# Patient Record
Sex: Female | Born: 1970
Health system: Southern US, Community
[De-identification: ages and names within clinical notes are randomized; demographics above are authoritative.]

## PROBLEM LIST (undated history)

## (undated) DIAGNOSIS — D649 Anemia, unspecified: Secondary | ICD-10-CM

## (undated) DIAGNOSIS — E785 Hyperlipidemia, unspecified: Secondary | ICD-10-CM

## (undated) DIAGNOSIS — T7840XA Allergy, unspecified, initial encounter: Secondary | ICD-10-CM

## (undated) DIAGNOSIS — K219 Gastro-esophageal reflux disease without esophagitis: Secondary | ICD-10-CM

## (undated) DIAGNOSIS — I10 Essential (primary) hypertension: Secondary | ICD-10-CM

## (undated) DIAGNOSIS — R7989 Other specified abnormal findings of blood chemistry: Secondary | ICD-10-CM

## (undated) DIAGNOSIS — N951 Menopausal and female climacteric states: Secondary | ICD-10-CM

## (undated) DIAGNOSIS — E860 Dehydration: Secondary | ICD-10-CM

## (undated) DIAGNOSIS — L309 Dermatitis, unspecified: Secondary | ICD-10-CM

## (undated) HISTORY — DX: Gastro-esophageal reflux disease without esophagitis: K21.9

## (undated) HISTORY — DX: Essential (primary) hypertension: I10

## (undated) HISTORY — DX: Anemia, unspecified: D64.9

## (undated) HISTORY — DX: Menopausal and female climacteric states: N95.1

## (undated) HISTORY — DX: Allergy, unspecified, initial encounter: T78.40XA

## (undated) HISTORY — DX: Dehydration: E86.0

## (undated) HISTORY — DX: Hyperlipidemia, unspecified: E78.5

## (undated) HISTORY — DX: Other specified abnormal findings of blood chemistry: R79.89

## (undated) HISTORY — PX: COLONOSCOPY: SHX174

## (undated) HISTORY — PX: NO PAST SURGERIES: SHX2092

## (undated) HISTORY — DX: Dermatitis, unspecified: L30.9

---

## 2011-06-14 ENCOUNTER — Ambulatory Visit (INDEPENDENT_AMBULATORY_CARE_PROVIDER_SITE_OTHER): Payer: 59 | Admitting: Internal Medicine

## 2011-06-14 ENCOUNTER — Encounter: Payer: Self-pay | Admitting: Internal Medicine

## 2011-06-14 VITALS — BP 130/86 | HR 81 | Ht 59.5 in | Wt 149.0 lb

## 2011-06-14 DIAGNOSIS — R42 Dizziness and giddiness: Secondary | ICD-10-CM

## 2011-06-14 LAB — CBC WITH DIFFERENTIAL/PLATELET
Basophils Absolute: 0 10*3/uL (ref 0.0–0.1)
Eosinophils Absolute: 0.5 10*3/uL (ref 0.0–0.7)
Eosinophils Relative: 7 % — ABNORMAL HIGH (ref 0–5)
HCT: 41.3 % (ref 36.0–46.0)
Lymphocytes Relative: 26 % (ref 12–46)
Lymphs Abs: 1.9 10*3/uL (ref 0.7–4.0)
MCH: 27.2 pg (ref 26.0–34.0)
MCV: 85.9 fL (ref 78.0–100.0)
Monocytes Absolute: 0.4 10*3/uL (ref 0.1–1.0)
Platelets: 371 10*3/uL (ref 150–400)
RDW: 13.6 % (ref 11.5–15.5)
WBC: 7.2 10*3/uL (ref 4.0–10.5)

## 2011-06-14 LAB — BASIC METABOLIC PANEL
BUN: 11 mg/dL (ref 6–23)
CO2: 25 mEq/L (ref 19–32)
Calcium: 9.8 mg/dL (ref 8.4–10.5)
Chloride: 100 mEq/L (ref 96–112)
Creat: 0.72 mg/dL (ref 0.50–1.10)

## 2011-06-14 MED ORDER — MECLIZINE HCL 25 MG PO TABS: 25.0000 mg | ORAL_TABLET | Freq: Three times a day (TID) | ORAL | Status: AC | PRN

## 2011-06-19 DIAGNOSIS — R42 Dizziness and giddiness: Secondary | ICD-10-CM | POA: Insufficient documentation

## 2011-06-19 NOTE — Progress Notes (Signed)
  Subjective:    Patient ID: Tara Greene, female    DOB: 1971-05-02, 40 y.o.   MRN: [id number]  HPI patient is going to establish primary care and for evaluation of dizziness. Negative three-day history of dizziness with associated nausea. No emesis or neurologic deficits such as numbness tingling or weakness. There has been no presyncope or syncope. Sensation worsened with head turning or turning in the bed. No recent infection fever or chills. Taking no medication for this. Last Pap smear 2005 and declines further evaluations currently. No other complaint  Reviewed past medical history, past surgical history, medications, allergies, social history and family history  Review of Systems  Constitutional: Negative for fever, chills and fatigue.  Gastrointestinal: Positive for nausea. Negative for vomiting.  Neurological: Positive for dizziness. Negative for syncope, facial asymmetry, speech difficulty, weakness, numbness and headaches.       Objective:   Physical Exam    Physical Exam  Vitals reviewed. Constitutional:  appears well-developed and well-nourished. No distress.  HENT:  Head: Normocephalic and atraumatic.  Right Ear: Tympanic membrane, external ear and ear canal normal.  Left Ear: Tympanic membrane, external ear and ear canal normal.  Nose: Nose normal.  Mouth/Throat: Oropharynx is clear and moist. No oropharyngeal exudate.  Eyes: Conjunctivae and EOM are normal. Pupils are equal, round, and reactive to light. Right eye exhibits no discharge. Left eye exhibits no discharge. No scleral icterus.  Neck: Neck supple. No thyromegaly present.  Cardiovascular: Normal rate, regular rhythm and normal heart sounds.  Exam reveals no gallop and no friction rub.   No murmur heard. Pulmonary/Chest: Effort normal and breath sounds normal. No respiratory distress.  has no wheezes.  has no rales.  Lymphadenopathy:   no cervical adenopathy.  Neurological:  is alert.  Skin: Skin is warm  and dry.  not diaphoretic.  Psychiatric: normal mood and affect.      Assessment & Plan:

## 2011-06-19 NOTE — Assessment & Plan Note (Signed)
Obtain CBC and Chem-7. Suspect viral labyrinthitis. Provide Antivert p.r.n. Followup if no improvement or worsening.

## 2011-09-19 ENCOUNTER — Ambulatory Visit (INDEPENDENT_AMBULATORY_CARE_PROVIDER_SITE_OTHER): Payer: 59 | Admitting: Internal Medicine

## 2011-09-19 ENCOUNTER — Encounter: Payer: Self-pay | Admitting: Internal Medicine

## 2011-09-19 ENCOUNTER — Other Ambulatory Visit: Payer: Self-pay | Admitting: Internal Medicine

## 2011-09-19 DIAGNOSIS — Z0000 Encounter for general adult medical examination without abnormal findings: Secondary | ICD-10-CM

## 2011-09-19 DIAGNOSIS — Z1239 Encounter for other screening for malignant neoplasm of breast: Secondary | ICD-10-CM

## 2011-09-19 DIAGNOSIS — Z1231 Encounter for screening mammogram for malignant neoplasm of breast: Secondary | ICD-10-CM

## 2011-09-19 LAB — LIPID PANEL
Cholesterol: 189 mg/dL (ref 0–200)
HDL: 45 mg/dL (ref >39)
Total CHOL/HDL Ratio: 4.2 Ratio
Triglycerides: 87 mg/dL (ref <150)
VLDL: 17 mg/dL (ref 0–40)

## 2011-09-19 LAB — URINALYSIS
Bilirubin Urine: NEGATIVE
Glucose, UA: NEGATIVE mg/dL
Ketones, ur: NEGATIVE mg/dL
Protein, ur: 30 mg/dL — AB

## 2011-09-19 LAB — BASIC METABOLIC PANEL
BUN: 11 mg/dL (ref 6–23)
Calcium: 9.2 mg/dL (ref 8.4–10.5)
Creat: 0.74 mg/dL (ref 0.50–1.10)
Glucose, Bld: 84 mg/dL (ref 70–99)

## 2011-09-19 LAB — HEPATIC FUNCTION PANEL
Albumin: 4.2 g/dL (ref 3.5–5.2)
Bilirubin, Direct: 0.1 mg/dL (ref 0.0–0.3)
Total Bilirubin: 0.5 mg/dL (ref 0.3–1.2)

## 2011-09-19 NOTE — Patient Instructions (Signed)
Please schedule screening mammogram for next week.  Please schedule a visit with Melissa for your pap smear at your convenience.

## 2011-09-20 DIAGNOSIS — Z0000 Encounter for general adult medical examination without abnormal findings: Secondary | ICD-10-CM | POA: Insufficient documentation

## 2011-09-20 NOTE — Assessment & Plan Note (Signed)
Nl exam. Obtain screening labs. Schedule screening mammogram and pap/pelvic/cbe with female provider. Influenza vaccination provided

## 2011-09-20 NOTE — Progress Notes (Signed)
  Subjective:    Patient ID: Tara Greene, female    DOB: 1971/04/17, 40 y.o.   MRN: [id number]  HPI Pt presents to clinic for annual exam. Overdue for mammogram with no h/o abnormals. Recalls last pap smear ~2005 and prefers female provider. Last seen for dizziness thought to be secondary to possible viral labyrinthitis. Sx's resolved after several days and have not recurred. No complaints.   Past Medical History  Diagnosis Date  . GERD (gastroesophageal reflux disease)    No past surgical history on file.  reports that she has never smoked. She has never used smokeless tobacco. She reports that she does not drink alcohol or use illicit drugs. family history includes Hyperlipidemia in her mother and Hypertension in her mother. No Known Allergies     Review of Systems  Constitutional: Negative for unexpected weight change.  Respiratory: Negative for shortness of breath.   Cardiovascular: Negative for chest pain.  Gastrointestinal: Negative for abdominal pain.  Neurological: Negative for dizziness.       Objective:   Physical Exam  Nursing note and vitals reviewed. Constitutional: She appears well-developed and well-nourished. No distress.  HENT:  Head: Normocephalic and atraumatic.  Right Ear: Tympanic membrane, external ear and ear canal normal.  Left Ear: Tympanic membrane, external ear and ear canal normal.  Nose: Nose normal.  Mouth/Throat: Uvula is midline, oropharynx is clear and moist and mucous membranes are normal. No oropharyngeal exudate.  Eyes: Conjunctivae and EOM are normal. Pupils are equal, round, and reactive to light. Right eye exhibits no discharge. Left eye exhibits no discharge. No scleral icterus.  Neck: Neck supple. Carotid bruit is not present.  Cardiovascular: Normal rate, regular rhythm, normal heart sounds and intact distal pulses.  Exam reveals no gallop and no friction rub.   No murmur heard. Pulmonary/Chest: Effort normal and breath sounds  normal. No respiratory distress. She has no wheezes. She has no rales.  Abdominal: Soft. Normal appearance and bowel sounds are normal. She exhibits no distension and no mass. There is no hepatosplenomegaly. There is no tenderness. There is no rigidity.  Lymphadenopathy:    She has no cervical adenopathy.  Neurological: She is alert.  Skin: Skin is warm and dry. She is not diaphoretic.  Psychiatric: She has a normal mood and affect.          Assessment & Plan:

## 2011-09-27 ENCOUNTER — Other Ambulatory Visit (HOSPITAL_COMMUNITY)
Admission: RE | Admit: 2011-09-27 | Discharge: 2011-09-27 | Disposition: A | Discharge status: Home / Self Care | Payer: 59 | Source: Ambulatory Visit | Attending: Family | Admitting: Family

## 2011-09-27 ENCOUNTER — Ambulatory Visit (INDEPENDENT_AMBULATORY_CARE_PROVIDER_SITE_OTHER): Payer: 59 | Admitting: Family

## 2011-09-27 ENCOUNTER — Encounter: Payer: Self-pay | Admitting: Family

## 2011-09-27 ENCOUNTER — Ambulatory Visit (HOSPITAL_BASED_OUTPATIENT_CLINIC_OR_DEPARTMENT_OTHER)
Admission: RE | Admit: 2011-09-27 | Discharge: 2011-09-27 | Disposition: A | Discharge status: Home / Self Care | Payer: 59 | Source: Ambulatory Visit | Attending: Internal Medicine | Admitting: Internal Medicine

## 2011-09-27 DIAGNOSIS — K6389 Other specified diseases of intestine: Secondary | ICD-10-CM | POA: Insufficient documentation

## 2011-09-27 DIAGNOSIS — Z01419 Encounter for gynecological examination (general) (routine) without abnormal findings: Secondary | ICD-10-CM | POA: Insufficient documentation

## 2011-09-27 DIAGNOSIS — Z1239 Encounter for other screening for malignant neoplasm of breast: Secondary | ICD-10-CM

## 2011-09-27 DIAGNOSIS — N858 Other specified noninflammatory disorders of uterus: Secondary | ICD-10-CM | POA: Insufficient documentation

## 2011-09-27 DIAGNOSIS — Z1231 Encounter for screening mammogram for malignant neoplasm of breast: Secondary | ICD-10-CM

## 2011-09-27 DIAGNOSIS — N9489 Other specified conditions associated with female genital organs and menstrual cycle: Secondary | ICD-10-CM

## 2011-09-27 NOTE — Progress Notes (Signed)
  Subjective:    Patient ID: Tara Greene, female    DOB: 03/23/1971, 40 y.o.   MRN: [id number]  HPI  Tara Greene is a 40 yr old female who presents today for her GYN examination. She reports that periods are regular.  Last 3 days. Occasional cramping.  [id number].  Denies concerns about STD exposure.  Last pap smear was greater than 2 years ago. Has never had an abnormal pap smear.  She has not had regular pap smears.    Review of Systems    see HPI  Past Medical History  Diagnosis Date  . GERD (gastroesophageal reflux disease)     History   Social History  . Marital Status: Married    Spouse Name: N/A    Number of Children: 3  . 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 Smoking status: Never Smoker   . Smokeless tobacco: Never Used  . Alcohol Use: No  . Drug Use: No  . Sexually Active: Not on file   Other Topics Concern  . Not on file   Social History Narrative  . No narrative on file    No past surgical history on file.  Family History  Problem Relation Age of Onset  . Hypertension Mother   . Hyperlipidemia Mother     No Known Allergies  Current Outpatient Prescriptions on File Prior to Visit  Medication Sig Dispense Refill  . Multiple Vitamin (MULTIVITAMIN) tablet Take 1 tablet by mouth daily.          BP 122/86  Pulse 78  Temp(Src) 97.8 F (36.6 C) (Oral)  Resp 16  Wt 152 lb 1.3 oz (68.983 kg)  LMP 09/20/2011    Objective:   Physical Exam  Constitutional: She appears well-developed and well-nourished.  Abdominal: Soft. She exhibits no distension and no mass. There is no tenderness. There is no guarding.  Musculoskeletal: She exhibits no edema.  Skin: Skin is warm and dry.  Psychiatric: She has a normal mood and affect. Her behavior is normal. Judgment and thought content normal.    Breasts:Declined Inguinal/mons: Normal without inguinal adenopathy  External genitalia: Normal    BUS/Urethra/Skene's glands: Normal  Bladder: Normal  Vagina: Normal  Cervix: + nabothian cyst noted near cervical os Uterus: normal in size, shape. Midline and mobile. Small mobile Pedunculated mass Palpated on uterus adjacent to the cervix on the left Adnexa/parametria:  Rt: Without masses or tenderness.  Lt: Without masses or tenderness.  Anus and perineum: Normal        Assessment & Plan:

## 2011-09-27 NOTE — Assessment & Plan Note (Signed)
Pt declined breast exam today- had mammogram earlier today.  Results are pending.  Pap performed today. She will need a follow up pap in 1 year if normal as she has not had regular screening.

## 2011-09-27 NOTE — Assessment & Plan Note (Signed)
Pt is noted to have a palpable pedunculated mass on the left lower uterus.  Likely an external fibroid, but will refer to GYN for further evaluation.

## 2011-09-27 NOTE — Patient Instructions (Signed)
We will contact you about your referral to GYN. We will mail you a copy of your Pap smear results.

## 2011-09-30 ENCOUNTER — Encounter: Payer: Self-pay | Admitting: Family

## 2012-04-03 ENCOUNTER — Ambulatory Visit (INDEPENDENT_AMBULATORY_CARE_PROVIDER_SITE_OTHER): Payer: 59 | Admitting: Internal Medicine

## 2012-04-03 ENCOUNTER — Encounter: Payer: Self-pay | Admitting: Internal Medicine

## 2012-04-03 VITALS — BP 126/82 | HR 112 | Temp 98.1°F | Resp 16 | Wt 153.0 lb

## 2012-04-03 DIAGNOSIS — J40 Bronchitis, not specified as acute or chronic: Secondary | ICD-10-CM | POA: Insufficient documentation

## 2012-04-03 MED ORDER — AMOXICILLIN-POT CLAVULANATE 875-125 MG PO TABS: 1.0000 | ORAL_TABLET | Freq: Two times a day (BID) | ORAL | Status: AC

## 2012-04-03 NOTE — Progress Notes (Signed)
  Subjective:    Patient ID: Tara Greene, female    DOB: 07/05/1971, 41 y.o.   MRN: [id number]  HPI Pt presents to clinic for evaluation of cough and nasal congestion. Has had difficulty with allergies since last month but last 5+ days notes f/c, increasing nasal congestion/drainage (yellow) and cough productive for yellow sputum. Has noted blood tinged/streaks in sputum without gross hemoptysis. Taking otc AH/decongestant without improvement.   Past Medical History  Diagnosis Date  . GERD (gastroesophageal reflux disease)    No past surgical history on file.  reports that she has never smoked. She has never used smokeless tobacco. She reports that she does not drink alcohol or use illicit drugs. family history includes Hyperlipidemia in her mother and Hypertension in her mother. No Known Allergies   Review of Systems see hpi     Objective:   Physical Exam  Nursing note and vitals reviewed. Constitutional: She appears well-developed and well-nourished. No distress.  HENT:  Head: Normocephalic and atraumatic.  Right Ear: Tympanic membrane, external ear and ear canal normal.  Left Ear: Tympanic membrane, external ear and ear canal normal.  Nose: Nose normal.  Mouth/Throat: Oropharynx is clear and moist. No oropharyngeal exudate.  Eyes: Conjunctivae are normal. No scleral icterus.  Neck: Neck supple.  Cardiovascular: Normal rate, regular rhythm and normal heart sounds.   Pulmonary/Chest: Effort normal and breath sounds normal. No respiratory distress. She has no wheezes. She has no rales.  Lymphadenopathy:    She has no cervical adenopathy.  Neurological: She is alert.  Skin: She is not diaphoretic.  Psychiatric: She has a normal mood and affect.          Assessment & Plan:

## 2012-04-03 NOTE — Assessment & Plan Note (Signed)
Begin abx tx. Continue otc sx relief prn. Followup if no improvement or worsening.

## 2012-06-14 ENCOUNTER — Ambulatory Visit: Payer: 59 | Admitting: Internal Medicine

## 2012-06-14 DIAGNOSIS — Z0289 Encounter for other administrative examinations: Secondary | ICD-10-CM

## 2012-08-31 ENCOUNTER — Telehealth: Payer: Self-pay | Admitting: Internal Medicine

## 2012-08-31 ENCOUNTER — Other Ambulatory Visit: Payer: Self-pay | Admitting: Internal Medicine

## 2012-08-31 DIAGNOSIS — Z0000 Encounter for general adult medical examination without abnormal findings: Secondary | ICD-10-CM

## 2012-08-31 DIAGNOSIS — Z1231 Encounter for screening mammogram for malignant neoplasm of breast: Secondary | ICD-10-CM

## 2012-09-07 LAB — CBC WITH DIFFERENTIAL/PLATELET
Basophils Absolute: 0 10*3/uL (ref 0.0–0.1)
Basophils Relative: 0 % (ref 0–1)
Eosinophils Absolute: 0.3 10*3/uL (ref 0.0–0.7)
Eosinophils Relative: 7 % — ABNORMAL HIGH (ref 0–5)
HCT: 35.3 % — ABNORMAL LOW (ref 36.0–46.0)
MCH: 25.1 pg — ABNORMAL LOW (ref 26.0–34.0)
MCHC: 32.3 g/dL (ref 30.0–36.0)
MCV: 77.6 fL — ABNORMAL LOW (ref 78.0–100.0)
Monocytes Absolute: 0.3 10*3/uL (ref 0.1–1.0)
Platelets: 362 10*3/uL (ref 150–400)
RDW: 15.7 % — ABNORMAL HIGH (ref 11.5–15.5)

## 2012-09-07 NOTE — Telephone Encounter (Signed)
Orders entered and given to the lab. 

## 2012-09-07 NOTE — Telephone Encounter (Signed)
Yes pls

## 2012-09-07 NOTE — Telephone Encounter (Signed)
Is it ok to order: cbc w/diff, bmp, lipids/lfts, tsh and u/a--v70.0 for physical labs?

## 2012-09-08 LAB — HEPATIC FUNCTION PANEL
Albumin: 3.9 g/dL (ref 3.5–5.2)
Indirect Bilirubin: 0.5 mg/dL (ref 0.0–0.9)
Total Bilirubin: 0.6 mg/dL (ref 0.3–1.2)
Total Protein: 6.8 g/dL (ref 6.0–8.3)

## 2012-09-08 LAB — URINALYSIS, MICROSCOPIC ONLY
Bacteria, UA: NONE SEEN
Squamous Epithelial / LPF: NONE SEEN

## 2012-09-08 LAB — URINALYSIS, ROUTINE W REFLEX MICROSCOPIC
Bilirubin Urine: NEGATIVE
Glucose, UA: NEGATIVE mg/dL
Ketones, ur: NEGATIVE mg/dL
Protein, ur: NEGATIVE mg/dL

## 2012-09-08 LAB — BASIC METABOLIC PANEL
CO2: 27 mEq/L (ref 19–32)
Calcium: 9.1 mg/dL (ref 8.4–10.5)
Creat: 0.69 mg/dL (ref 0.50–1.10)
Glucose, Bld: 90 mg/dL (ref 70–99)
Sodium: 140 mEq/L (ref 135–145)

## 2012-09-08 LAB — LIPID PANEL
HDL: 43 mg/dL (ref >39)
Total CHOL/HDL Ratio: 4.9 Ratio
Triglycerides: 102 mg/dL (ref <150)

## 2012-09-11 ENCOUNTER — Ambulatory Visit (INDEPENDENT_AMBULATORY_CARE_PROVIDER_SITE_OTHER): Payer: 59 | Admitting: Internal Medicine

## 2012-09-11 ENCOUNTER — Encounter: Payer: Self-pay | Admitting: Internal Medicine

## 2012-09-11 VITALS — BP 124/82 | HR 78 | Temp 98.5°F | Resp 14 | Ht 60.0 in | Wt 149.2 lb

## 2012-09-11 DIAGNOSIS — D649 Anemia, unspecified: Secondary | ICD-10-CM

## 2012-09-11 DIAGNOSIS — Z0000 Encounter for general adult medical examination without abnormal findings: Secondary | ICD-10-CM

## 2012-09-11 DIAGNOSIS — E785 Hyperlipidemia, unspecified: Secondary | ICD-10-CM

## 2012-09-11 NOTE — Patient Instructions (Signed)
Please schedule a visit with Tara Greene for your pap smear. Prior to that visit please got to the lab for non fasting tests (cbc, ferritin, b12-anemia)

## 2012-09-15 DIAGNOSIS — D649 Anemia, unspecified: Secondary | ICD-10-CM | POA: Insufficient documentation

## 2012-09-15 DIAGNOSIS — E785 Hyperlipidemia, unspecified: Secondary | ICD-10-CM | POA: Insufficient documentation

## 2012-09-15 NOTE — Progress Notes (Signed)
  Subjective:    Patient ID: Tara Greene, female    DOB: 11-12-1971, 41 y.o.   MRN: [id number]  HPI patient presents to clinic for annual exam. Requests female for GYN exam. Reviewed mild anemia with hemoglobin of 11.4. LDL cholesterol mildly elevated. Due for mammogram.  Past Medical History  Diagnosis Date  . GERD (gastroesophageal reflux disease)    No past surgical history on file.  reports that she has never smoked. She has never used smokeless tobacco. She reports that she does not drink alcohol or use illicit drugs. family history includes Hyperlipidemia in her mother and Hypertension in her mother. No Known Allergies   Review of Systems see history of present illness     Objective:   Physical Exam  Nursing note and vitals reviewed. Constitutional: She appears well-developed and well-nourished. No distress.  HENT:  Head: Normocephalic and atraumatic.  Right Ear: External ear normal.  Left Ear: External ear normal.  Nose: Nose normal.  Mouth/Throat: Oropharynx is clear and moist. No oropharyngeal exudate.  Eyes: Conjunctivae normal are normal. Pupils are equal, round, and reactive to light. No scleral icterus.  Neck: Neck supple. No thyromegaly present.  Cardiovascular: Normal rate, regular rhythm, normal heart sounds and intact distal pulses.  Exam reveals no gallop and no friction rub.   No murmur heard. Pulmonary/Chest: Effort normal and breath sounds normal. No respiratory distress. She has no wheezes. She has no rales.  Abdominal: Soft. Bowel sounds are normal. She exhibits no distension and no mass. There is no tenderness. There is no rebound and no guarding.  Lymphadenopathy:    She has no cervical adenopathy.  Neurological: She is alert.  Skin: Skin is warm and dry. She is not diaphoretic.  Psychiatric: She has a normal mood and affect.          Assessment & Plan:

## 2012-09-15 NOTE — Assessment & Plan Note (Signed)
Normal exam. Schedule mammogram. Return for GYN exam.

## 2012-09-15 NOTE — Assessment & Plan Note (Signed)
Return for repeat CBC, ferritin and B12 level

## 2012-09-15 NOTE — Assessment & Plan Note (Signed)
Mild elevations. Pursue low-fat diet and regular exercise

## 2012-09-18 ENCOUNTER — Encounter: Payer: Self-pay | Admitting: Family

## 2012-09-18 ENCOUNTER — Ambulatory Visit (INDEPENDENT_AMBULATORY_CARE_PROVIDER_SITE_OTHER): Payer: 59 | Admitting: Family

## 2012-09-18 ENCOUNTER — Other Ambulatory Visit (HOSPITAL_COMMUNITY)
Admission: RE | Admit: 2012-09-18 | Discharge: 2012-09-18 | Disposition: A | Discharge status: Home / Self Care | Payer: 59 | Source: Ambulatory Visit | Attending: Family | Admitting: Family

## 2012-09-18 VITALS — BP 136/82 | HR 79 | Temp 97.9°F | Resp 16 | Ht 60.0 in | Wt 150.0 lb

## 2012-09-18 DIAGNOSIS — N858 Other specified noninflammatory disorders of uterus: Secondary | ICD-10-CM

## 2012-09-18 DIAGNOSIS — N9489 Other specified conditions associated with female genital organs and menstrual cycle: Secondary | ICD-10-CM

## 2012-09-18 DIAGNOSIS — Z01419 Encounter for gynecological examination (general) (routine) without abnormal findings: Secondary | ICD-10-CM | POA: Insufficient documentation

## 2012-09-18 DIAGNOSIS — Z124 Encounter for screening for malignant neoplasm of cervix: Secondary | ICD-10-CM

## 2012-09-18 NOTE — Assessment & Plan Note (Addendum)
Unchanged.  Likely small fibroid.  She tells me she did see GYN last year for this, but we did not receive any paperwork back from them. Will request consult report.

## 2012-09-18 NOTE — Assessment & Plan Note (Signed)
Clinically stable.  Pap performed today.

## 2012-09-18 NOTE — Patient Instructions (Addendum)
We will contact you with the results of your pap smear.  

## 2012-09-18 NOTE — Addendum Note (Signed)
Addended by: Mervin Kung A on: 09/18/2012 02:14 PM   Modules accepted: Orders

## 2012-09-18 NOTE — Progress Notes (Signed)
  Subjective:    Patient ID: Tara Greene, female    DOB: 09/27/1971, 41 y.o.   MRN: [id number]  HPI  Tara Greene is a 41 yr old female who presents today for pap smear.  She reports no problems with menses.  LMP 10/12.  Denies hx of abnormal pap. Mammogram up to date.      Review of Systems See HPI.     Objective:   Physical Exam  Constitutional: She appears well-developed and well-nourished. No distress.  Abdominal: Soft.  Skin: Skin is warm and dry.  Psychiatric: She has a normal mood and affect. Her behavior is normal. Judgment and thought content normal.  Inguinal/mons: Normal without inguinal adenopathy  External genitalia: Normal  BUS/Urethra/Skene's glands: Normal  Bladder: Normal  Vagina: Normal  Cervix: Normal  Uterus: normal in size, shape.  Small pedunculated mass noted left lower external uterus unchanged from last year.  Most consistent with small fibroid. Uterus is  Midline and mobile  Adnexa/parametria:  Rt: Without masses or tenderness.  Lt: Without masses or tenderness.  Anus and perineum: Normal          Assessment & Plan:

## 2012-09-21 ENCOUNTER — Encounter: Payer: Self-pay | Admitting: Family

## 2012-10-02 ENCOUNTER — Ambulatory Visit (HOSPITAL_BASED_OUTPATIENT_CLINIC_OR_DEPARTMENT_OTHER)
Admission: RE | Admit: 2012-10-02 | Discharge: 2012-10-02 | Disposition: A | Discharge status: Home / Self Care | Payer: 59 | Source: Ambulatory Visit | Attending: Internal Medicine | Admitting: Internal Medicine

## 2012-10-02 DIAGNOSIS — Z1231 Encounter for screening mammogram for malignant neoplasm of breast: Secondary | ICD-10-CM | POA: Insufficient documentation

## 2013-06-27 ENCOUNTER — Emergency Department (HOSPITAL_COMMUNITY)
Admission: EM | Admit: 2013-06-27 | Discharge: 2013-06-27 | Disposition: A | Discharge status: Home / Self Care | Payer: 59 | Source: Home / Self Care | Attending: Family Medicine | Admitting: Family Medicine

## 2013-06-27 ENCOUNTER — Encounter (HOSPITAL_COMMUNITY): Payer: Self-pay | Admitting: Emergency Medicine

## 2013-06-27 DIAGNOSIS — Z23 Encounter for immunization: Secondary | ICD-10-CM

## 2013-06-27 DIAGNOSIS — S61209A Unspecified open wound of unspecified finger without damage to nail, initial encounter: Secondary | ICD-10-CM

## 2013-06-27 DIAGNOSIS — S61210A Laceration without foreign body of right index finger without damage to nail, initial encounter: Secondary | ICD-10-CM

## 2013-06-27 MED ORDER — TETANUS-DIPHTH-ACELL PERTUSSIS 5-2.5-18.5 LF-MCG/0.5 IM SUSP
0.5000 mL | Freq: Once | INTRAMUSCULAR | Status: AC
  Administered 2013-06-27: 0.5 mL via INTRAMUSCULAR

## 2013-06-27 MED ORDER — TETANUS-DIPHTH-ACELL PERTUSSIS 5-2.5-18.5 LF-MCG/0.5 IM SUSP
INTRAMUSCULAR | Status: AC
  Filled 2013-06-27: qty 0.5

## 2013-06-27 MED ORDER — MUPIROCIN 2 % EX OINT: TOPICAL_OINTMENT | Freq: Three times a day (TID) | CUTANEOUS | Status: DC

## 2013-06-27 NOTE — ED Notes (Signed)
Pt is soaking finger in sterile water 

## 2013-06-27 NOTE — ED Provider Notes (Addendum)
CSN: [id number]     Arrival date & time 06/27/13  1735 History     First MD Initiated Contact with Patient 06/27/13 1751     Chief Complaint  Patient presents with  . Extremity Laceration   (Consider location/radiation/quality/duration/timing/severity/associated sxs/prior Treatment) HPI Comments: 42 year old nondiabetic female here complaining of a laceration to her right index finger with a kitchen knife was she was cutting the tip of a pain bottle while working on an [employer] about an hour ago. denies numbness or weakness. Bleeding has stopped. Last tetanus immunization around 10 years ago.    Past Medical History  Diagnosis Date  . GERD (gastroesophageal reflux disease)    History reviewed. No pertinent past surgical history. Family History  Problem Relation Age of Onset  . Hypertension Mother   . Hyperlipidemia Mother    History  Substance Use Topics  . Smoking status: Never Smoker   . Smokeless tobacco: Never Used  . Alcohol Use: No   OB History   Grav Para Term Preterm Abortions TAB SAB Ect Mult Living                 Review of Systems  Skin: Positive for wound.       As per history of present illness  Neurological: Negative for weakness and numbness.    Allergies  Review of patient's allergies indicates no known allergies.  Home Medications   Current Outpatient Rx  Name  Route  Sig  Dispense  Refill  . cetirizine (ZYRTEC) 10 MG tablet   Oral   Take 10 mg by mouth as needed.          . loratadine-pseudoephedrine (CLARITIN-D 24-HOUR) 10-240 MG per 24 hr tablet   Oral   Take 1 tablet by mouth as needed.          . Multiple Vitamin (MULTIVITAMIN) tablet   Oral   Take 1 tablet by mouth daily.           . mupirocin ointment (BACTROBAN) 2 %   Topical   Apply topically 3 (three) times daily.   22 g   0    BP 147/89  Pulse 70  Temp(Src) 98.5 F (36.9 C) (Oral)  Resp 18  SpO2 100%  LMP 06/24/2013 Physical Exam  Nursing note and vitals  reviewed. Constitutional: She is oriented to person, place, and time. She appears well-developed and well-nourished. No distress.  Cardiovascular: Normal heart sounds.   Pulmonary/Chest: Breath sounds normal.  Musculoskeletal:  Full range of motion of right index finger at the MP joint, PIP joint and DIP joint.   Neurological: She is alert and oriented to person, place, and time.  Intact superficial and deep sensation of the right index finger and right hand entirely.  Skin: She is not diaphoretic.  There is a oblique 1 inch linear, clean hemostatic laceration on the dorsal radial side of proximal phalanx of the index finger. No associated hematoma. No foreign bodies.    ED Course   LACERATION REPAIR Date/Time: 06/27/2013 8:22 AM Performed by: Sharin Grave Authorized by: Sharin Grave Consent: Verbal consent obtained. Risks and benefits: risks, benefits and alternatives were discussed Consent given by: patient Patient understanding: patient states understanding of the procedure being performed Patient consent: the patient's understanding of the procedure matches consent given Body area: upper extremity Location details: right index finger Laceration length: 2 cm Foreign bodies: no foreign bodies Tendon involvement: none Nerve involvement: none Vascular damage: no Anesthesia: local infiltration Local anesthetic:  lidocaine 1% with epinephrine Anesthetic total: 2 ml Preparation: Patient was prepped and draped in the usual sterile fashion. Irrigation solution: saline Amount of cleaning: standard Debridement: none Degree of undermining: none Skin closure: 3-0 Prolene Number of sutures: 7 Technique: simple Approximation: close Approximation difficulty: simple Dressing: antibiotic ointment, 4x4 sterile gauze and gauze roll Patient tolerance: Patient tolerated the procedure well with no immediate complications.   (including critical care time)  Labs Reviewed - No  data to display No results found. 1. Laceration of right index finger w/o foreign body w/o damage to nail, initial encounter     MDM  Status post suture repair today. Tdap administered. Prescribed mupirocin ointment. Supportive care including 1 care and red flags that should prompt her return to medical attention discussed with patient and provided in writing. Specifically patient was asked to return in 7 days for suture removal or earlier if redness swelling or drainage.  Sharin Grave, MD 06/28/13 1610  Sharin Grave, MD 06/28/13 [id number]

## 2013-06-27 NOTE — ED Notes (Signed)
Pt c/o laceration to right index finger onset about an hour ago... Laceration is about 1 inch... Reports she was doing arts and craft project w/a kitchen knife; knife slipped and cut her finger... Bleeding controlled; last tetanus <10 yrs... Alert w/no signs of acute distress.

## 2013-10-14 ENCOUNTER — Ambulatory Visit (INDEPENDENT_AMBULATORY_CARE_PROVIDER_SITE_OTHER): Payer: 59 | Admitting: Family Medicine

## 2013-10-14 ENCOUNTER — Encounter: Payer: Self-pay | Admitting: Family Medicine

## 2013-10-14 VITALS — BP 142/82 | HR 71 | Temp 98.4°F | Ht 60.0 in | Wt 144.1 lb

## 2013-10-14 DIAGNOSIS — T7840XA Allergy, unspecified, initial encounter: Secondary | ICD-10-CM | POA: Insufficient documentation

## 2013-10-14 DIAGNOSIS — D649 Anemia, unspecified: Secondary | ICD-10-CM

## 2013-10-14 DIAGNOSIS — Z0000 Encounter for general adult medical examination without abnormal findings: Secondary | ICD-10-CM

## 2013-10-14 DIAGNOSIS — L309 Dermatitis, unspecified: Secondary | ICD-10-CM

## 2013-10-14 DIAGNOSIS — Z5189 Encounter for other specified aftercare: Secondary | ICD-10-CM

## 2013-10-14 DIAGNOSIS — T7840XD Allergy, unspecified, subsequent encounter: Secondary | ICD-10-CM

## 2013-10-14 DIAGNOSIS — E785 Hyperlipidemia, unspecified: Secondary | ICD-10-CM

## 2013-10-14 DIAGNOSIS — L259 Unspecified contact dermatitis, unspecified cause: Secondary | ICD-10-CM

## 2013-10-14 LAB — RENAL FUNCTION PANEL
BUN: 17 mg/dL (ref 6–23)
Chloride: 103 mEq/L (ref 96–112)
Creat: 0.83 mg/dL (ref 0.50–1.10)
Glucose, Bld: 94 mg/dL (ref 70–99)
Phosphorus: 2.9 mg/dL (ref 2.3–4.6)
Potassium: 4.2 mEq/L (ref 3.5–5.3)

## 2013-10-14 LAB — CBC
MCH: 26.1 pg (ref 26.0–34.0)
Platelets: 366 10*3/uL (ref 150–400)
RBC: 4.64 MIL/uL (ref 3.87–5.11)

## 2013-10-14 LAB — LIPID PANEL
HDL: 49 mg/dL (ref >39)
LDL Cholesterol: 103 mg/dL — ABNORMAL HIGH (ref 0–99)
Triglycerides: 86 mg/dL (ref <150)
VLDL: 17 mg/dL (ref 0–40)

## 2013-10-14 MED ORDER — TRIAMCINOLONE ACETONIDE 0.1 % EX CREA: 1.0000 "application " | TOPICAL_CREAM | Freq: Two times a day (BID) | CUTANEOUS | Status: DC

## 2013-10-14 NOTE — Patient Instructions (Signed)
Clotrimazole cream twice daily til a week after gone Hydrate    Eczema Atopic dermatitis, or eczema, is an inherited type of sensitive skin. Often people with eczema have a family history of allergies, asthma, or hay fever. It causes a red itchy rash and dry scaly skin. The itchiness may occur before the skin rash and may be very intense. It is not contagious. Eczema is generally worse during the cooler winter months and often improves with the warmth of summer. Eczema usually starts showing signs in infancy. Some children outgrow eczema, but it may last through adulthood. Flare-ups may be caused by:  Eating something or contact with something you are sensitive or allergic to.  Stress. DIAGNOSIS  The diagnosis of eczema is usually based upon symptoms and medical history. TREATMENT  Eczema cannot be cured, but symptoms usually can be controlled with treatment or avoidance of allergens (things to which you are sensitive or allergic to).  Controlling the itching and scratching.  Use over-the-counter antihistamines as directed for itching. It is especially useful at night when the itching tends to be worse.  Use over-the-counter steroid creams as directed for itching.  Scratching makes the rash and itching worse and may cause impetigo (a skin infection) if fingernails are contaminated (dirty).  Keeping the skin well moisturized with creams every day. This will seal in moisture and help prevent dryness. Lotions containing alcohol and water can dry the skin and are not recommended.  Limiting exposure to allergens.  Recognizing situations that cause stress.  Developing a plan to manage stress. HOME CARE INSTRUCTIONS   Take prescription and over-the-counter medicines as directed by your caregiver.  Do not use anything on the skin without checking with your caregiver.  Keep baths or showers short (5 minutes) in warm (not hot) water. Use mild cleansers for bathing. You may add non-perfumed  bath oil to the bath water. It is best to avoid soap and bubble bath.  Immediately after a bath or shower, when the skin is still damp, apply a moisturizing ointment to the entire body. This ointment should be a petroleum ointment. This will seal in moisture and help prevent dryness. The thicker the ointment the better. These should be unscented.  Keep fingernails cut short and wash hands often. If your child has eczema, it may be necessary to put soft gloves or mittens on your child at night.  Dress in clothes made of cotton or cotton blends. Dress lightly, as heat increases itching.  Avoid foods that may cause flare-ups. Common foods include cow's milk, peanut butter, eggs and wheat.  Keep a child with eczema away from anyone with fever blisters. The virus that causes fever blisters (herpes simplex) can cause a serious skin infection in children with eczema. SEEK MEDICAL CARE IF:   Itching interferes with sleep.  The rash gets worse or is not better within one week following treatment.  The rash looks infected (pus or soft yellow scabs).  You or your child has an oral temperature above 102 F (38.9 C).  Your baby is older than 3 months with a rectal temperature of 100.5 F (38.1 C) or higher for more than 1 day.  The rash flares up after contact with someone who has fever blisters. SEEK IMMEDIATE MEDICAL CARE IF:   Your baby is older than 3 months with a rectal temperature of 102 F (38.9 C) or higher.  Your baby is older than 3 months or younger with a rectal temperature of 100.4 F (38  C) or higher. Document Released: 11/11/2000 Document Revised: 02/06/2012 Document Reviewed: 06/17/2013 [facility] Patient Information 2014 [place], [place]. Body Ringworm Ringworm (tinea corporis) is a fungal infection of the skin on the body. This infection is not caused by worms, but is actually caused by a fungus. Fungus normally lives on the top of your skin and can be useful. However, in the  case of ringworms, the fungus grows out of control and causes a skin infection. It can involve any area of skin on the body and can spread easily from one person to another (contagious). Ringworm is a common problem for children, but it can affect adults as well. Ringworm is also often found in athletes, especially wrestlers who share equipment and mats.  CAUSES  Ringworm of the body is caused by a fungus called dermatophyte. It can spread by:  Touchingother people who are infected.  Touchinginfected pets.  Touching or sharingobjects that have been in contact with the infected person or pet (hats, combs, towels, clothing, sports equipment). SYMPTOMS   Itchy, raised red spots and bumps on the skin.  Ring-shaped rash.  Redness near the border of the rash with a clear center.  Dry and scaly skin on or around the rash. Not every person develops a ring-shaped rash. Some develop only the red, scaly patches. DIAGNOSIS  Most often, ringworm can be diagnosed by performing a skin exam. Your caregiver may choose to take a skin scraping from the affected area. The sample will be examined under the microscope to see if the fungus is present.  TREATMENT  Body ringworm may be treated with a topical antifungal cream or ointment. Sometimes, an antifungal shampoo that can be used on your body is prescribed. You may be prescribed antifungal medicines to take by mouth if your ringworm is severe, keeps coming back, or lasts a long time.  HOME CARE INSTRUCTIONS   Only take over-the-counter or prescription medicines as directed by your caregiver.  Wash the infected area and dry it completely before applying yourcream or ointment.  When using antifungal shampoo to treat the ringworm, leave the shampoo on the body for 3 5 minutes before rinsing.   Wear loose clothing to stop clothes from rubbing and irritating the rash.  Wash or change your bed sheets every night while you have the rash.  Have your  pet treated by your veterinarian if it has the same infection. To prevent ringworm:   Practice good hygiene.  Wear sandals or shoes in public places and showers.  Do not share personal items with others.  Avoid touching red patches of skin on other people.  Avoid touching pets that have bald spots or wash your hands after doing so. SEEK MEDICAL CARE IF:   Your rash continues to spread after 7 days of treatment.  Your rash is not gone in 4 weeks.  The area around your rash becomes red, warm, tender, and swollen. Document Released: 11/11/2000 Document Revised: 08/08/2012 Document Reviewed: 05/28/2012 [facility] Patient Information 2014 [place], [place].

## 2013-10-14 NOTE — Progress Notes (Signed)
Tara Greene [id number] 28-Dec-1970 10/14/2013      Progress Note-Follow Up  Subjective  Chief Complaint  Chief Complaint  Patient presents with  . Annual Exam    physical    HPI  Patient is a 42 year old female who is in today for annual exam. She feels well but she struggling with that rash at the moment. It is pruritic and has been present for several weeks. She is stressed over-the-counter treatments without great results. Denies any trauma or previous similar rash. No other complaints. Does struggle with allergies but they are well controlled on the ranitidine. No fevers, headache, chest pain, palpitations, shortness of breath, GI or GU concerns at this time.  Past Medical History  Diagnosis Date  . GERD (gastroesophageal reflux disease)     History reviewed. No pertinent past surgical history.  Family History  Problem Relation Age of Onset  . Hypertension Mother   . Hyperlipidemia Mother     History   Social History  . Marital Status: Married    Spouse Name: N/A    Number of Children: 3  . 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 Smoking status: Never Smoker   . Smokeless tobacco: Never Used  . Alcohol Use: No  . Drug Use: No  . Sexual Activity: Not on file   Other Topics Concern  . Not on file   Social History Narrative  . No narrative on file    Current Outpatient Prescriptions on File Prior to Visit  Medication Sig Dispense Refill  . Multiple Vitamin (MULTIVITAMIN) tablet Take 1 tablet by mouth daily.        . cetirizine (ZYRTEC) 10 MG tablet Take 10 mg by mouth as needed.       . loratadine-pseudoephedrine (CLARITIN-D 24-HOUR) 10-240 MG per 24 hr tablet Take 1 tablet by mouth as needed.        No current facility-administered medications on file prior to visit.    No Known Allergies  Review of Systems  Review of Systems  Constitutional: Negative for fever and malaise/fatigue.  HENT:  Negative for congestion.   Eyes: Negative for discharge.  Respiratory: Negative for shortness of breath.   Cardiovascular: Negative for chest pain, palpitations and leg swelling.  Gastrointestinal: Negative for nausea, abdominal pain and diarrhea.  Genitourinary: Negative for dysuria.  Musculoskeletal: Negative for falls.  Skin: Positive for itching and rash.  Neurological: Negative for loss of consciousness and headaches.  Endo/Heme/Allergies: Negative for polydipsia.  Psychiatric/Behavioral: Negative for depression and suicidal ideas. The patient is not nervous/anxious and does not have insomnia.     Objective  BP 142/82  Pulse 71  Temp(Src) 98.4 F (36.9 C) (Oral)  Ht 5' (1.524 m)  Wt 144 lb 1.9 oz (65.372 kg)  BMI 28.15 kg/m2  SpO2 97%  LMP 09/11/2013  Physical Exam  Physical Exam  Constitutional: She is oriented to person, place, and time and well-developed, well-nourished, and in no distress. No distress.  HENT:  Head: Normocephalic and atraumatic.  Right Ear: External ear normal.  Left Ear: External ear normal.  Nose: Nose normal.  Mouth/Throat: Oropharynx is clear and moist. No oropharyngeal exudate.  Eyes: Conjunctivae are normal. Pupils are equal, round, and reactive to light. Right eye exhibits no discharge. Left eye exhibits no discharge. No scleral icterus.  Neck: Normal range of motion. Neck supple. No thyromegaly present.  Cardiovascular: Normal rate, regular rhythm, normal heart  sounds and intact distal pulses.   No murmur heard. Pulmonary/Chest: Effort normal and breath sounds normal. No respiratory distress. She has no wheezes. She has no rales.  Abdominal: Soft. Bowel sounds are normal. She exhibits no distension and no mass. There is no tenderness.  Musculoskeletal: Normal range of motion. She exhibits no edema and no tenderness.  Lymphadenopathy:    She has no cervical adenopathy.  Neurological: She is alert and oriented to person, place, and time. She  has normal reflexes. No cranial nerve deficit. Coordination normal.  Skin: Skin is warm and dry. Rash noted. She is not diaphoretic. There is erythema.  Round raised patches on right arm. Scattered maculopapular lesions elsewhere  Psychiatric: Mood, memory and affect normal.    Lab Results  Component Value Date   TSH 2.335 09/07/2012   Lab Results  Component Value Date   WBC 5.2 09/07/2012   HGB 11.4* 09/07/2012   HCT 35.3* 09/07/2012   MCV 77.6* 09/07/2012   PLT 362 09/07/2012   Lab Results  Component Value Date   CREATININE 0.69 09/07/2012   BUN 8 09/07/2012   NA 140 09/07/2012   K 3.9 09/07/2012   CL 105 09/07/2012   CO2 27 09/07/2012   Lab Results  Component Value Date   ALT 18 09/07/2012   AST 19 09/07/2012   ALKPHOS 61 09/07/2012   BILITOT 0.6 09/07/2012   Lab Results  Component Value Date   CHOL 209* 09/07/2012   Lab Results  Component Value Date   HDL 43 09/07/2012   Lab Results  Component Value Date   LDLCALC 146* 09/07/2012   Lab Results  Component Value Date   TRIG 102 09/07/2012   Lab Results  Component Value Date   CHOLHDL 4.9 09/07/2012     Assessment & Plan  Dermatitis Eczema vs ringworm. Right arm appears more like ringworm apply Clotrimazole then triamcinolone for other lestions just Triamcinolone, notify us if no improvement  Anemia Resolved with recent labs  Hyperlipidemia Mild, improved. No changes  Allergy Responding to LoratadineD may continue the same  Routine general medical examination at a health care facility Encouraged heart healthy diet, regular exercise and adequate sleep. Reviewed annual labs received flu shot on 09/11/13

## 2013-10-14 NOTE — Progress Notes (Signed)
Pre visit review using our clinic review tool, if applicable. No additional management support is needed unless otherwise documented below in the visit note. 

## 2013-10-18 ENCOUNTER — Encounter: Payer: Self-pay | Admitting: Family Medicine

## 2013-10-18 DIAGNOSIS — L309 Dermatitis, unspecified: Secondary | ICD-10-CM

## 2013-10-18 DIAGNOSIS — E785 Hyperlipidemia, unspecified: Secondary | ICD-10-CM

## 2013-10-18 HISTORY — DX: Hyperlipidemia, unspecified: E78.5

## 2013-10-18 HISTORY — DX: Dermatitis, unspecified: L30.9

## 2013-10-18 NOTE — Assessment & Plan Note (Signed)
Encouraged heart healthy diet, regular exercise and adequate sleep. Reviewed annual labs received flu shot on 09/11/13

## 2013-10-18 NOTE — Assessment & Plan Note (Signed)
Responding to LoratadineD may continue the same

## 2013-10-18 NOTE — Assessment & Plan Note (Signed)
Eczema vs ringworm. Right arm appears more like ringworm apply Clotrimazole then triamcinolone for other lestions just Triamcinolone, notify us if no improvement

## 2013-10-18 NOTE — Assessment & Plan Note (Signed)
Mild, improved. No changes

## 2013-10-18 NOTE — Assessment & Plan Note (Signed)
Resolved with recent labs. 

## 2014-04-07 ENCOUNTER — Other Ambulatory Visit (HOSPITAL_COMMUNITY)
Admission: RE | Admit: 2014-04-07 | Discharge: 2014-04-07 | Disposition: A | Discharge status: Home / Self Care | Payer: 59 | Source: Ambulatory Visit | Attending: Family Medicine | Admitting: Family Medicine

## 2014-04-07 ENCOUNTER — Encounter: Payer: Self-pay | Admitting: Family Medicine

## 2014-04-07 ENCOUNTER — Ambulatory Visit (INDEPENDENT_AMBULATORY_CARE_PROVIDER_SITE_OTHER): Payer: 59 | Admitting: Family Medicine

## 2014-04-07 VITALS — BP 124/78 | HR 88 | Temp 98.5°F | Ht 60.0 in | Wt 152.0 lb

## 2014-04-07 DIAGNOSIS — N951 Menopausal and female climacteric states: Secondary | ICD-10-CM

## 2014-04-07 DIAGNOSIS — Z01419 Encounter for gynecological examination (general) (routine) without abnormal findings: Secondary | ICD-10-CM

## 2014-04-07 DIAGNOSIS — E785 Hyperlipidemia, unspecified: Secondary | ICD-10-CM

## 2014-04-07 DIAGNOSIS — T7840XA Allergy, unspecified, initial encounter: Secondary | ICD-10-CM

## 2014-04-07 DIAGNOSIS — D649 Anemia, unspecified: Secondary | ICD-10-CM

## 2014-04-07 MED ORDER — FLUTICASONE PROPIONATE 50 MCG/ACT NA SUSP: 1.0000 | Freq: Every day | NASAL | Status: DC

## 2014-04-07 MED ORDER — FEXOFENADINE HCL 180 MG PO TABS: 180.0000 mg | ORAL_TABLET | Freq: Every day | ORAL | Status: AC

## 2014-04-07 NOTE — Assessment & Plan Note (Signed)
resolved 

## 2014-04-07 NOTE — Assessment & Plan Note (Signed)
Mild, Encouraged heart healthy diet, increase exercise, avoid trans fats, consider a krill oil cap daily 

## 2014-04-07 NOTE — Progress Notes (Signed)
Patient ID: Tara Greene, female   DOB: [birth year], 43 y.o.   MRN: [id number] Tara Greene [id number] [birth year] 04/07/2014      Progress Note-Follow Up  Subjective  Chief Complaint  Chief Complaint  Patient presents with  . Gynecologic Exam    pap    HPI  Patient is a 43 year old female in today for routine medical care. Is here today for GYN exam doing well. No GYN c/o is having MGM next week. No breast concerns. Has struggled with some mild congestion this spring, no recent illness. Denies CP/palp/SOB/HA/congestion/fevers/GI or GU c/o. Taking meds as prescribed  Past Medical History  Diagnosis Date  . GERD (gastroesophageal reflux disease)   . Allergy   . Hyperlipidemia   . Anemia   . Other and unspecified hyperlipidemia 10/18/2013  . Dermatitis 10/18/2013    History reviewed. No pertinent past surgical history.  Family History  Problem Relation Age of Onset  . Hypertension Mother   . Hyperlipidemia Mother   . Cancer Mother     colon   . COPD Father     History   Social History  . Marital Status: Married    Spouse Name: N/A    Number of Children: 3  . 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 Smoking status: Never Smoker   . Smokeless tobacco: Never Used  . Alcohol Use: No  . Drug Use: No  . 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 Not on file   Social History Narrative  . No narrative on file    Current Outpatient Prescriptions on File Prior to Visit  Medication Sig Dispense Refill  . Multiple Vitamin (MULTIVITAMIN) tablet Take 1 tablet by mouth daily.        Marland Kitchen triamcinolone cream (KENALOG) 0.1 % Apply 1 application topically 2 (two) times daily. As needed for rash  85.2 g  2   No current facility-administered medications on file prior to visit.    No Known Allergies  Review of  Systems  Review of Systems  Constitutional: Negative for fever, chills and malaise/fatigue.  HENT: Positive for congestion. Negative for hearing loss and nosebleeds.   Eyes: Negative for discharge.  Respiratory: Negative for cough, sputum production, shortness of breath and wheezing.   Cardiovascular: Negative for chest pain, palpitations and leg swelling.  Gastrointestinal: Negative for heartburn, nausea, vomiting, abdominal pain, diarrhea, constipation and blood in stool.  Genitourinary: Negative for dysuria, urgency, frequency and hematuria.  Musculoskeletal: Negative for back pain, falls and myalgias.  Skin: Negative for rash.  Neurological: Negative for dizziness, tremors, sensory change, focal weakness, loss of consciousness, weakness and headaches.  Endo/Heme/Allergies: Negative for polydipsia. Does not bruise/bleed easily.  Psychiatric/Behavioral: Negative for depression and suicidal ideas. The patient is not nervous/anxious and does not have insomnia.     Objective  BP 124/78  Pulse 88  Temp(Src) 98.5 F (36.9 C) (Oral)  Ht 5' (1.524 m)  Wt 152 lb 0.6 oz (68.965 kg)  BMI 29.69 kg/m2  SpO2 96%  LMP 12/29/2013  Physical Exam  Physical Exam  Constitutional: She is oriented to person, place, and time and well-developed, well-nourished, and in no distress. No distress.  HENT:  Head: Normocephalic and atraumatic.  Eyes: Conjunctivae are normal.  Neck: Neck supple. No thyromegaly present.  Cardiovascular: Normal rate, regular rhythm and normal heart sounds.   No murmur heard. Pulmonary/Chest: Effort normal and breath sounds normal. She has no wheezes.  Abdominal: She exhibits no distension and no mass.  Genitourinary: Vagina normal, uterus normal, cervix normal, right adnexa normal and left adnexa normal. No vaginal discharge found.  Musculoskeletal: She exhibits no edema.  Lymphadenopathy:    She has no cervical adenopathy.  Neurological: She is alert and oriented to  person, place, and time.  Skin: Skin is warm and dry. No rash noted. She is not diaphoretic.  Psychiatric: Memory, affect and judgment normal.    Lab Results  Component Value Date   TSH 1.822 10/14/2013   Lab Results  Component Value Date   WBC 6.1 10/14/2013   HGB 12.1 10/14/2013   HCT 36.3 10/14/2013   MCV 78.2 10/14/2013   PLT 366 10/14/2013   Lab Results  Component Value Date   CREATININE 0.83 10/14/2013   BUN 17 10/14/2013   NA 137 10/14/2013   K 4.2 10/14/2013   CL 103 10/14/2013   CO2 26 10/14/2013   Lab Results  Component Value Date   ALT 18 09/07/2012   AST 19 09/07/2012   ALKPHOS 61 09/07/2012   BILITOT 0.6 09/07/2012   Lab Results  Component Value Date   CHOL 169 10/14/2013   Lab Results  Component Value Date   HDL 49 10/14/2013   Lab Results  Component Value Date   LDLCALC 103* 10/14/2013   Lab Results  Component Value Date   TRIG 86 10/14/2013   Lab Results  Component Value Date   CHOLHDL 3.4 10/14/2013     Assessment & Plan  Routine gynecological examination LMP 2/15. Hot flashes, insomnia. Pap today. Encouraged small, frequent meals, minimize carbs, alcohol, caffiene. Increase exercise  Hyperlipidemia Mild, Encouraged heart healthy diet, increase exercise, avoid trans fats, consider a krill oil cap daily  Anemia resolved  Allergy Good response to Allegra and Flonase. Continue the same  Perimenopause With hot flashes, insomnia. Encouraged regular exercise, small frequent meals with lean proteins, minimal carbs and adequate hydration. Consider Icool and call if worsens

## 2014-04-07 NOTE — Progress Notes (Signed)
Pre visit review using our clinic review tool, if applicable. No additional management support is needed unless otherwise documented below in the visit note. 

## 2014-04-07 NOTE — Patient Instructions (Signed)
Allergic Rhinitis Allergic rhinitis is when the mucous membranes in the nose respond to allergens. Allergens are particles in the air that cause your body to have an allergic reaction. This causes you to release allergic antibodies. Through a chain of events, these eventually cause you to release histamine into the blood stream. Although meant to protect the body, it is this release of histamine that causes your discomfort, such as frequent sneezing, congestion, and an itchy, runny nose.  CAUSES  Seasonal allergic rhinitis (hay fever) is caused by pollen allergens that may come from grasses, trees, and weeds. Year-round allergic rhinitis (perennial allergic rhinitis) is caused by allergens such as house dust mites, pet dander, and mold spores.  SYMPTOMS   Nasal stuffiness (congestion).  Itchy, runny nose with sneezing and tearing of the eyes. DIAGNOSIS  Your health care provider can help you determine the allergen or allergens that trigger your symptoms. If you and your health care provider are unable to determine the allergen, skin or blood testing may be used. TREATMENT  Allergic Rhinitis does not have a cure, but it can be controlled by:  Medicines and allergy shots (immunotherapy).  Avoiding the allergen. Hay fever may often be treated with antihistamines in pill or nasal spray forms. Antihistamines block the effects of histamine. There are over-the-counter medicines that may help with nasal congestion and swelling around the eyes. Check with your health care provider before taking or giving this medicine.  If avoiding the allergen or the medicine prescribed do not work, there are many new medicines your health care provider can prescribe. Stronger medicine may be used if initial measures are ineffective. Desensitizing injections can be used if medicine and avoidance does not work. Desensitization is when a patient is given ongoing shots until the body becomes less sensitive to the allergen.  Make sure you follow up with your health care provider if problems continue. HOME CARE INSTRUCTIONS It is not possible to completely avoid allergens, but you can reduce your symptoms by taking steps to limit your exposure to them. It helps to know exactly what you are allergic to so that you can avoid your specific triggers. SEEK MEDICAL CARE IF:   You have a fever.  You develop a cough that does not stop easily (persistent).  You have shortness of breath.  You start wheezing.  Symptoms interfere with normal daily activities. Document Released: 08/09/2001 Document Revised: 09/04/2013 Document Reviewed: 07/22/2013 [employer] Patient Information 2014 [employer]  

## 2014-04-07 NOTE — Assessment & Plan Note (Signed)
Good response to Allegra and Flonase. Continue the same

## 2014-04-07 NOTE — Assessment & Plan Note (Addendum)
LMP 2/15. Hot flashes, insomnia. Pap today. Encouraged small, frequent meals, minimize carbs, alcohol, caffiene. Increase exercise

## 2014-04-12 ENCOUNTER — Encounter: Payer: Self-pay | Admitting: Family Medicine

## 2014-04-12 DIAGNOSIS — N951 Menopausal and female climacteric states: Secondary | ICD-10-CM

## 2014-04-12 HISTORY — DX: Menopausal and female climacteric states: N95.1

## 2014-04-12 NOTE — Assessment & Plan Note (Signed)
With hot flashes, insomnia. Encouraged regular exercise, small frequent meals with lean proteins, minimal carbs and adequate hydration. Consider Icool and call if worsens

## 2014-05-19 ENCOUNTER — Other Ambulatory Visit: Payer: Self-pay

## 2014-05-19 DIAGNOSIS — Z1231 Encounter for screening mammogram for malignant neoplasm of breast: Secondary | ICD-10-CM

## 2014-06-04 ENCOUNTER — Encounter (INDEPENDENT_AMBULATORY_CARE_PROVIDER_SITE_OTHER): Payer: Self-pay

## 2014-06-04 ENCOUNTER — Ambulatory Visit
Admission: RE | Admit: 2014-06-04 | Discharge: 2014-06-04 | Disposition: A | Discharge status: Home / Self Care | Payer: 59 | Source: Ambulatory Visit

## 2014-06-04 DIAGNOSIS — Z1231 Encounter for screening mammogram for malignant neoplasm of breast: Secondary | ICD-10-CM

## 2015-04-13 ENCOUNTER — Encounter: Payer: 59 | Admitting: Family Medicine

## 2015-05-13 ENCOUNTER — Telehealth: Payer: Self-pay | Admitting: Family Medicine

## 2015-05-13 NOTE — Telephone Encounter (Signed)
Pre Visit letter sent  °

## 2015-06-04 ENCOUNTER — Encounter: Payer: Self-pay | Admitting: Family Medicine

## 2015-06-04 ENCOUNTER — Ambulatory Visit (INDEPENDENT_AMBULATORY_CARE_PROVIDER_SITE_OTHER): Payer: 59 | Admitting: Family Medicine

## 2015-06-04 VITALS — BP 104/72 | HR 83 | Temp 98.8°F | Ht 61.0 in | Wt 152.4 lb

## 2015-06-04 DIAGNOSIS — R7989 Other specified abnormal findings of blood chemistry: Secondary | ICD-10-CM

## 2015-06-04 DIAGNOSIS — E782 Mixed hyperlipidemia: Secondary | ICD-10-CM

## 2015-06-04 DIAGNOSIS — Z0000 Encounter for general adult medical examination without abnormal findings: Secondary | ICD-10-CM

## 2015-06-04 DIAGNOSIS — E785 Hyperlipidemia, unspecified: Secondary | ICD-10-CM

## 2015-06-04 DIAGNOSIS — T7840XD Allergy, unspecified, subsequent encounter: Secondary | ICD-10-CM

## 2015-06-04 NOTE — Progress Notes (Signed)
Pre visit review using our clinic review tool, if applicable. No additional management support is needed unless otherwise documented below in the visit note. 

## 2015-06-04 NOTE — Patient Instructions (Signed)
Probiotics daily such as [employer] or [facility], or online at [email or web address] company has 10 strain probiotic Curcumen/Turmeric caps 1 daily Vitamin D 2000 IU cap daily Krill oil or Fish oil daily  Salon pas gel or patch . Aspercreme/Lidocaine patch Ibuprofen 420m every 6 hours as needed for severe pain with food   Preventive Care for Adults A healthy lifestyle and preventive care can promote health and wellness. Preventive health guidelines for women include the following key practices.  A routine yearly physical is a good way to check with your health care provider about your health and preventive screening. It is a chance to share any concerns and updates on your health and to receive a thorough exam.  Visit your dentist for a routine exam and preventive care every 6 months. Brush your teeth twice a day and floss once a day. Good oral hygiene prevents tooth decay and gum disease.  The frequency of eye exams is based on your age, health, family medical history, use of contact lenses, and other factors. Follow your health care provider's recommendations for frequency of eye exams.  Eat a healthy diet. Foods like vegetables, fruits, whole grains, low-fat dairy products, and lean protein foods contain the nutrients you need without too many calories. Decrease your intake of foods high in solid fats, added sugars, and salt. Eat the right amount of calories for you.Get information about a proper diet from your health care provider, if necessary.  Regular physical exercise is one of the most important things you can do for your health. Most adults should get at least 150 minutes of moderate-intensity exercise (any activity that increases your heart rate and causes you to sweat) each week. In addition, most adults need muscle-strengthening exercises on 2 or more days a week.  Maintain a healthy weight. The body mass index (BMI) is a screening tool to identify possible  weight problems. It provides an estimate of body fat based on height and weight. Your health care provider can find your BMI and can help you achieve or maintain a healthy weight.For adults 20 years and older:  A BMI below 18.5 is considered underweight.  A BMI of 18.5 to 24.9 is normal.  A BMI of 25 to 29.9 is considered overweight.  A BMI of 30 and above is considered obese.  Maintain normal blood lipids and cholesterol levels by exercising and minimizing your intake of saturated fat. Eat a balanced diet with plenty of fruit and vegetables. Blood tests for lipids and cholesterol should begin at age 7077and be repeated every 5 years. If your lipid or cholesterol levels are high, you are over 50, or you are at high risk for heart disease, you may need your cholesterol levels checked more frequently.Ongoing high lipid and cholesterol levels should be treated with medicines if diet and exercise are not working.  If you smoke, find out from your health care provider how to quit. If you do not use tobacco, do not start.  Lung cancer screening is recommended for adults aged 587-80years who are at high risk for developing lung cancer because of a history of smoking. A yearly low-dose CT scan of the lungs is recommended for people who have at least a 30-pack-year history of smoking and are a current smoker or have quit within the past 15 years. A pack year of smoking is smoking an average of 1 pack of cigarettes a day for 1 year (for example: 1 pack a day for  30 years or 2 packs a day for 15 years). Yearly screening should continue until the smoker has stopped smoking for at least 15 years. Yearly screening should be stopped for people who develop a health problem that would prevent them from having lung cancer treatment.  If you are pregnant, do not drink alcohol. If you are breastfeeding, be very cautious about drinking alcohol. If you are not pregnant and choose to drink alcohol, do not have more than 1  drink per day. One drink is considered to be 12 ounces (355 mL) of beer, 5 ounces (148 mL) of wine, or 1.5 ounces (44 mL) of liquor.  Avoid use of street drugs. Do not share needles with anyone. Ask for help if you need support or instructions about stopping the use of drugs.  High blood pressure causes heart disease and increases the risk of stroke. Your blood pressure should be checked at least every 1 to 2 years. Ongoing high blood pressure should be treated with medicines if weight loss and exercise do not work.  If you are 73-31 years old, ask your health care provider if you should take aspirin to prevent strokes.  Diabetes screening involves taking a blood sample to check your fasting blood sugar level. This should be done once every 3 years, after age 58, if you are within normal weight and without risk factors for diabetes. Testing should be considered at a younger age or be carried out more frequently if you are overweight and have at least 1 risk factor for diabetes.  Breast cancer screening is essential preventive care for women. You should practice "breast self-awareness." This means understanding the normal appearance and feel of your breasts and may include breast self-examination. Any changes detected, no matter how small, should be reported to a health care provider. Women in their 88s and 30s should have a clinical breast exam (CBE) by a health care provider as part of a regular health exam every 1 to 3 years. After age 59, women should have a CBE every year. Starting at age 73, women should consider having a mammogram (breast X-ray test) every year. Women who have a family history of breast cancer should talk to their health care provider about genetic screening. Women at a high risk of breast cancer should talk to their health care providers about having an MRI and a mammogram every year.  Breast cancer gene (BRCA)-related cancer risk assessment is recommended for women who have  family members with BRCA-related cancers. BRCA-related cancers include breast, ovarian, tubal, and peritoneal cancers. Having family members with these cancers may be associated with an increased risk for harmful changes (mutations) in the breast cancer genes BRCA1 and BRCA2. Results of the assessment will determine the need for genetic counseling and BRCA1 and BRCA2 testing.  Routine pelvic exams to screen for cancer are no longer recommended for nonpregnant women who are considered low risk for cancer of the pelvic organs (ovaries, uterus, and vagina) and who do not have symptoms. Ask your health care provider if a screening pelvic exam is right for you.  If you have had past treatment for cervical cancer or a condition that could lead to cancer, you need Pap tests and screening for cancer for at least 20 years after your treatment. If Pap tests have been discontinued, your risk factors (such as having a new sexual partner) need to be reassessed to determine if screening should be resumed. Some women have medical problems that increase the chance of  getting cervical cancer. In these cases, your health care provider may recommend more frequent screening and Pap tests.  The HPV test is an additional test that may be used for cervical cancer screening. The HPV test looks for the virus that can cause the cell changes on the cervix. The cells collected during the Pap test can be tested for HPV. The HPV test could be used to screen women aged 12 years and older, and should be used in women of any age who have unclear Pap test results. After the age of 6, women should have HPV testing at the same frequency as a Pap test.  Colorectal cancer can be detected and often prevented. Most routine colorectal cancer screening begins at the age of 91 years and continues through age 28 years. However, your health care provider may recommend screening at an earlier age if you have risk factors for colon cancer. On a yearly  basis, your health care provider may provide home test kits to check for hidden blood in the stool. Use of a small camera at the end of a tube, to directly examine the colon (sigmoidoscopy or colonoscopy), can detect the earliest forms of colorectal cancer. Talk to your health care provider about this at age 59, when routine screening begins. Direct exam of the colon should be repeated every 5-10 years through age 21 years, unless early forms of pre-cancerous polyps or small growths are found.  People who are at an increased risk for hepatitis B should be screened for this virus. You are considered at high risk for hepatitis B if:  You were born in a country where hepatitis B occurs often. Talk with your health care provider about which countries are considered high risk.  Your parents were born in a high-risk country and you have not received a shot to protect against hepatitis B (hepatitis B vaccine).  You have HIV or AIDS.  You use needles to inject street drugs.  You live with, or have sex with, someone who has hepatitis B.  You get hemodialysis treatment.  You take certain medicines for conditions like cancer, organ transplantation, and autoimmune conditions.  Hepatitis C blood testing is recommended for all people born from [birth year] through 1965 and any individual with known risks for hepatitis C.  Practice safe sex. Use condoms and avoid high-risk sexual practices to reduce the spread of sexually transmitted infections (STIs). STIs include gonorrhea, chlamydia, syphilis, trichomonas, herpes, HPV, and human immunodeficiency virus (HIV). Herpes, HIV, and HPV are viral illnesses that have no cure. They can result in disability, cancer, and death.  You should be screened for sexually transmitted illnesses (STIs) including gonorrhea and chlamydia if:  You are sexually active and are younger than 24 years.  You are older than 24 years and your health care provider tells you that you are at  risk for this type of infection.  Your sexual activity has changed since you were last screened and you are at an increased risk for chlamydia or gonorrhea. Ask your health care provider if you are at risk.  If you are at risk of being infected with HIV, it is recommended that you take a prescription medicine daily to prevent HIV infection. This is called preexposure prophylaxis (PrEP). You are considered at risk if:  You are a heterosexual woman, are sexually active, and are at increased risk for HIV infection.  You take drugs by injection.  You are sexually active with a partner who has HIV.  Talk  with your health care provider about whether you are at high risk of being infected with HIV. If you choose to begin PrEP, you should first be tested for HIV. You should then be tested every 3 months for as long as you are taking PrEP.  Osteoporosis is a disease in which the bones lose minerals and strength with aging. This can result in serious bone fractures or breaks. The risk of osteoporosis can be identified using a bone density scan. Women ages 65 years and over and women at risk for fractures or osteoporosis should discuss screening with their health care providers. Ask your health care provider whether you should take a calcium supplement or vitamin D to reduce the rate of osteoporosis.  Menopause can be associated with physical symptoms and risks. Hormone replacement therapy is available to decrease symptoms and risks. You should talk to your health care provider about whether hormone replacement therapy is right for you.  Use sunscreen. Apply sunscreen liberally and repeatedly throughout the day. You should seek shade when your shadow is shorter than you. Protect yourself by wearing long sleeves, pants, a wide-brimmed hat, and sunglasses year round, whenever you are outdoors.  Once a month, do a whole body skin exam, using a mirror to look at the skin on your back. Tell your health care  provider of new moles, moles that have irregular borders, moles that are larger than a pencil eraser, or moles that have changed in shape or color.  Stay current with required vaccines (immunizations).  Influenza vaccine. All adults should be immunized every year.  Tetanus, diphtheria, and acellular pertussis (Td, Tdap) vaccine. Pregnant women should receive 1 dose of Tdap vaccine during each pregnancy. The dose should be obtained regardless of the length of time since the last dose. Immunization is preferred during the 27th-36th week of gestation. An adult who has not previously received Tdap or who does not know her vaccine status should receive 1 dose of Tdap. This initial dose should be followed by tetanus and diphtheria toxoids (Td) booster doses every 10 years. Adults with an unknown or incomplete history of completing a 3-dose immunization series with Td-containing vaccines should begin or complete a primary immunization series including a Tdap dose. Adults should receive a Td booster every 10 years.  Varicella vaccine. An adult without evidence of immunity to varicella should receive 2 doses or a second dose if she has previously received 1 dose. Pregnant females who do not have evidence of immunity should receive the first dose after pregnancy. This first dose should be obtained before leaving the health care facility. The second dose should be obtained 4-8 weeks after the first dose.  Human papillomavirus (HPV) vaccine. Females aged 13-26 years who have not received the vaccine previously should obtain the 3-dose series. The vaccine is not recommended for use in pregnant females. However, pregnancy testing is not needed before receiving a dose. If a female is found to be pregnant after receiving a dose, no treatment is needed. In that case, the remaining doses should be delayed until after the pregnancy. Immunization is recommended for any person with an immunocompromised condition through the  age of 32 years if she did not get any or all doses earlier. During the 3-dose series, the second dose should be obtained 4-8 weeks after the first dose. The third dose should be obtained 24 weeks after the first dose and 16 weeks after the second dose.  Zoster vaccine. One dose is recommended for adults aged  60 years or older unless certain conditions are present.  Measles, mumps, and rubella (MMR) vaccine. Adults born before [birth year] generally are considered immune to measles and mumps. Adults born in [birth year] or later should have 1 or more doses of MMR vaccine unless there is a contraindication to the vaccine or there is laboratory evidence of immunity to each of the three diseases. A routine second dose of MMR vaccine should be obtained at least 28 days after the first dose for students attending postsecondary schools, health care workers, or international travelers. People who received inactivated measles vaccine or an unknown type of measles vaccine during 1963-1967 should receive 2 doses of MMR vaccine. People who received inactivated mumps vaccine or an unknown type of mumps vaccine before 1979 and are at high risk for mumps infection should consider immunization with 2 doses of MMR vaccine. For females of childbearing age, rubella immunity should be determined. If there is no evidence of immunity, females who are not pregnant should be vaccinated. If there is no evidence of immunity, females who are pregnant should delay immunization until after pregnancy. Unvaccinated health care workers born before [birth year] who lack laboratory evidence of measles, mumps, or rubella immunity or laboratory confirmation of disease should consider measles and mumps immunization with 2 doses of MMR vaccine or rubella immunization with 1 dose of MMR vaccine.  Pneumococcal 13-valent conjugate (PCV13) vaccine. When indicated, a person who is uncertain of her immunization history and has no record of immunization should receive the  PCV13 vaccine. An adult aged 34 years or older who has certain medical conditions and has not been previously immunized should receive 1 dose of PCV13 vaccine. This PCV13 should be followed with a dose of pneumococcal polysaccharide (PPSV23) vaccine. The PPSV23 vaccine dose should be obtained at least 8 weeks after the dose of PCV13 vaccine. An adult aged 44 years or older who has certain medical conditions and previously received 1 or more doses of PPSV23 vaccine should receive 1 dose of PCV13. The PCV13 vaccine dose should be obtained 1 or more years after the last PPSV23 vaccine dose.  Pneumococcal polysaccharide (PPSV23) vaccine. When PCV13 is also indicated, PCV13 should be obtained first. All adults aged 75 years and older should be immunized. An adult younger than age 58 years who has certain medical conditions should be immunized. Any person who resides in a nursing home or long-term care facility should be immunized. An adult smoker should be immunized. People with an immunocompromised condition and certain other conditions should receive both PCV13 and PPSV23 vaccines. People with human immunodeficiency virus (HIV) infection should be immunized as soon as possible after diagnosis. Immunization during chemotherapy or radiation therapy should be avoided. Routine use of PPSV23 vaccine is not recommended for American Indians, [place] Natives, or people younger than 65 years unless there are medical conditions that require PPSV23 vaccine. When indicated, people who have unknown immunization and have no record of immunization should receive PPSV23 vaccine. One-time revaccination 5 years after the first dose of PPSV23 is recommended for people aged 19-64 years who have chronic kidney failure, nephrotic syndrome, asplenia, or immunocompromised conditions. People who received 1-2 doses of PPSV23 before age 56 years should receive another dose of PPSV23 vaccine at age 14 years or later if at least 5 years have  passed since the previous dose. Doses of PPSV23 are not needed for people immunized with PPSV23 at or after age 51 years.  Meningococcal vaccine. Adults with asplenia or persistent complement component deficiencies  should receive 2 doses of quadrivalent meningococcal conjugate (MenACWY-D) vaccine. The doses should be obtained at least 2 months apart. Microbiologists working with certain meningococcal bacteria, [place] recruits, people at risk during an outbreak, and people who travel to or live in countries with a high rate of meningitis should be immunized. A first-year college student up through age 37 years who is living in a residence hall should receive a dose if she did not receive a dose on or after her 16th birthday. Adults who have certain high-risk conditions should receive one or more doses of vaccine.  Hepatitis A vaccine. Adults who wish to be protected from this disease, have certain high-risk conditions, work with hepatitis A-infected animals, work in hepatitis A research labs, or travel to or work in countries with a high rate of hepatitis A should be immunized. Adults who were previously unvaccinated and who anticipate close contact with an international adoptee during the first 60 days after arrival in the [place] from a country with a high rate of hepatitis A should be immunized.  Hepatitis B vaccine. Adults who wish to be protected from this disease, have certain high-risk conditions, may be exposed to blood or other infectious body fluids, are household contacts or sex partners of hepatitis B positive people, are clients or workers in certain care facilities, or travel to or work in countries with a high rate of hepatitis B should be immunized.  Haemophilus influenzae type b (Hib) vaccine. A previously unvaccinated person with asplenia or sickle cell disease or having a scheduled splenectomy should receive 1 dose of Hib vaccine. Regardless of previous immunization, a recipient of  a hematopoietic stem cell transplant should receive a 3-dose series 6-12 months after her successful transplant. Hib vaccine is not recommended for adults with HIV infection. Preventive Services / Frequency Ages 64 to 85 years  Blood pressure check.** / Every 1 to 2 years.  Lipid and cholesterol check.** / Every 5 years beginning at age 76.  Clinical breast exam.** / Every 3 years for women in their 17s and 71s.  BRCA-related cancer risk assessment.** / For women who have family members with a BRCA-related cancer (breast, ovarian, tubal, or peritoneal cancers).  Pap test.** / Every 2 years from ages 72 through 23. Every 3 years starting at age 90 through age 68 or 45 with a history of 3 consecutive normal Pap tests.  HPV screening.** / Every 3 years from ages 53 through ages 23 to 79 with a history of 3 consecutive normal Pap tests.  Hepatitis C blood test.** / For any individual with known risks for hepatitis C.  Skin self-exam. / Monthly.  Influenza vaccine. / Every year.  Tetanus, diphtheria, and acellular pertussis (Tdap, Td) vaccine.** / Consult your health care provider. Pregnant women should receive 1 dose of Tdap vaccine during each pregnancy. 1 dose of Td every 10 years.  Varicella vaccine.** / Consult your health care provider. Pregnant females who do not have evidence of immunity should receive the first dose after pregnancy.  HPV vaccine. / 3 doses over 6 months, if 6 and younger. The vaccine is not recommended for use in pregnant females. However, pregnancy testing is not needed before receiving a dose.  Measles, mumps, rubella (MMR) vaccine.** / You need at least 1 dose of MMR if you were born in 1957 or later. You may also need a 2nd dose. For females of childbearing age, rubella immunity should be determined. If there is no evidence of immunity, females  who are not pregnant should be vaccinated. If there is no evidence of immunity, females who are pregnant should delay  immunization until after pregnancy.  Pneumococcal 13-valent conjugate (PCV13) vaccine.** / Consult your health care provider.  Pneumococcal polysaccharide (PPSV23) vaccine.** / 1 to 2 doses if you smoke cigarettes or if you have certain conditions.  Meningococcal vaccine.** / 1 dose if you are age 71 to 57 years and a Market researcher living in a residence hall, or have one of several medical conditions, you need to get vaccinated against meningococcal disease. You may also need additional booster doses.  Hepatitis A vaccine.** / Consult your health care provider.  Hepatitis B vaccine.** / Consult your health care provider.  Haemophilus influenzae type b (Hib) vaccine.** / Consult your health care provider. Ages 66 to 69 years  Blood pressure check.** / Every 1 to 2 years.  Lipid and cholesterol check.** / Every 5 years beginning at age 8 years.  Lung cancer screening. / Every year if you are aged 26-80 years and have a 30-pack-year history of smoking and currently smoke or have quit within the past 15 years. Yearly screening is stopped once you have quit smoking for at least 15 years or develop a health problem that would prevent you from having lung cancer treatment.  Clinical breast exam.** / Every year after age 56 years.  BRCA-related cancer risk assessment.** / For women who have family members with a BRCA-related cancer (breast, ovarian, tubal, or peritoneal cancers).  Mammogram.** / Every year beginning at age 2 years and continuing for as long as you are in good health. Consult with your health care provider.  Pap test.** / Every 3 years starting at age 77 years through age 41 or 37 years with a history of 3 consecutive normal Pap tests.  HPV screening.** / Every 3 years from ages 5 years through ages 43 to 60 years with a history of 3 consecutive normal Pap tests.  Fecal occult blood test (FOBT) of stool. / Every year beginning at age 36 years and continuing  until age 61 years. You may not need to do this test if you get a colonoscopy every 10 years.  Flexible sigmoidoscopy or colonoscopy.** / Every 5 years for a flexible sigmoidoscopy or every 10 years for a colonoscopy beginning at age 64 years and continuing until age 79 years.  Hepatitis C blood test.** / For all people born from [birth year] through 1967 and any individual with known risks for hepatitis C.  Skin self-exam. / Monthly.  Influenza vaccine. / Every year.  Tetanus, diphtheria, and acellular pertussis (Tdap/Td) vaccine.** / Consult your health care provider. Pregnant women should receive 1 dose of Tdap vaccine during each pregnancy. 1 dose of Td every 10 years.  Varicella vaccine.** / Consult your health care provider. Pregnant females who do not have evidence of immunity should receive the first dose after pregnancy.  Zoster vaccine.** / 1 dose for adults aged 13 years or older.  Measles, mumps, rubella (MMR) vaccine.** / You need at least 1 dose of MMR if you were born in 1957 or later. You may also need a 2nd dose. For females of childbearing age, rubella immunity should be determined. If there is no evidence of immunity, females who are not pregnant should be vaccinated. If there is no evidence of immunity, females who are pregnant should delay immunization until after pregnancy.  Pneumococcal 13-valent conjugate (PCV13) vaccine.** / Consult your health care provider.  Pneumococcal polysaccharide (PPSV23)  vaccine.** / 1 to 2 doses if you smoke cigarettes or if you have certain conditions.  Meningococcal vaccine.** / Consult your health care provider.  Hepatitis A vaccine.** / Consult your health care provider.  Hepatitis B vaccine.** / Consult your health care provider.  Haemophilus influenzae type b (Hib) vaccine.** / Consult your health care provider. Ages 75 years and over  Blood pressure check.** / Every 1 to 2 years.  Lipid and cholesterol check.** / Every 5 years  beginning at age 69 years.  Lung cancer screening. / Every year if you are aged 41-80 years and have a 30-pack-year history of smoking and currently smoke or have quit within the past 15 years. Yearly screening is stopped once you have quit smoking for at least 15 years or develop a health problem that would prevent you from having lung cancer treatment.  Clinical breast exam.** / Every year after age 51 years.  BRCA-related cancer risk assessment.** / For women who have family members with a BRCA-related cancer (breast, ovarian, tubal, or peritoneal cancers).  Mammogram.** / Every year beginning at age 44 years and continuing for as long as you are in good health. Consult with your health care provider.  Pap test.** / Every 3 years starting at age 25 years through age 51 or 16 years with 3 consecutive normal Pap tests. Testing can be stopped between 65 and 70 years with 3 consecutive normal Pap tests and no abnormal Pap or HPV tests in the past 10 years.  HPV screening.** / Every 3 years from ages 16 years through ages 64 or 40 years with a history of 3 consecutive normal Pap tests. Testing can be stopped between 65 and 70 years with 3 consecutive normal Pap tests and no abnormal Pap or HPV tests in the past 10 years.  Fecal occult blood test (FOBT) of stool. / Every year beginning at age 31 years and continuing until age 28 years. You may not need to do this test if you get a colonoscopy every 10 years.  Flexible sigmoidoscopy or colonoscopy.** / Every 5 years for a flexible sigmoidoscopy or every 10 years for a colonoscopy beginning at age 26 years and continuing until age 48 years.  Hepatitis C blood test.** / For all people born from [birth year] through 1967 and any individual with known risks for hepatitis C.  Osteoporosis screening.** / A one-time screening for women ages 57 years and over and women at risk for fractures or osteoporosis.  Skin self-exam. / Monthly.  Influenza vaccine. /  Every year.  Tetanus, diphtheria, and acellular pertussis (Tdap/Td) vaccine.** / 1 dose of Td every 10 years.  Varicella vaccine.** / Consult your health care provider.  Zoster vaccine.** / 1 dose for adults aged 13 years or older.  Pneumococcal 13-valent conjugate (PCV13) vaccine.** / Consult your health care provider.  Pneumococcal polysaccharide (PPSV23) vaccine.** / 1 dose for all adults aged 50 years and older.  Meningococcal vaccine.** / Consult your health care provider.  Hepatitis A vaccine.** / Consult your health care provider.  Hepatitis B vaccine.** / Consult your health care provider.  Haemophilus influenzae type b (Hib) vaccine.** / Consult your health care provider. ** Family history and personal history of risk and conditions may change your health care provider's recommendations. Document Released: 01/10/2002 Document Revised: 03/31/2014 Document Reviewed: 04/11/2011 [facility] Patient Information 2015 [place], [place]. This information is not intended to replace advice given to you by your health care provider. Make sure you discuss any questions you  have with your health care provider.

## 2015-06-05 ENCOUNTER — Other Ambulatory Visit (INDEPENDENT_AMBULATORY_CARE_PROVIDER_SITE_OTHER): Payer: 59

## 2015-06-05 ENCOUNTER — Telehealth: Payer: Self-pay | Admitting: Family Medicine

## 2015-06-05 DIAGNOSIS — R946 Abnormal results of thyroid function studies: Secondary | ICD-10-CM

## 2015-06-05 DIAGNOSIS — R7989 Other specified abnormal findings of blood chemistry: Secondary | ICD-10-CM

## 2015-06-05 LAB — LIPID PANEL
CHOLESTEROL: 183 mg/dL (ref 0–200)
HDL: 42.9 mg/dL (ref >39.00)
LDL CALC: 100 mg/dL — AB (ref 0–99)
NONHDL: 140.1
TRIGLYCERIDES: 199 mg/dL — AB (ref 0.0–149.0)
Total CHOL/HDL Ratio: 4
VLDL: 39.8 mg/dL (ref 0.0–40.0)

## 2015-06-05 LAB — COMPREHENSIVE METABOLIC PANEL
ALBUMIN: 4 g/dL (ref 3.5–5.2)
ALT: 23 U/L (ref 0–35)
AST: 22 U/L (ref 0–37)
Alkaline Phosphatase: 70 U/L (ref 39–117)
BUN: 13 mg/dL (ref 6–23)
CALCIUM: 9.4 mg/dL (ref 8.4–10.5)
CHLORIDE: 102 meq/L (ref 96–112)
CO2: 32 mEq/L (ref 19–32)
Creatinine, Ser: 0.76 mg/dL (ref 0.40–1.20)
GFR: 86.93 mL/min (ref >60.00)
Glucose, Bld: 67 mg/dL — ABNORMAL LOW (ref 70–99)
POTASSIUM: 3.5 meq/L (ref 3.5–5.1)
Sodium: 140 mEq/L (ref 135–145)
Total Bilirubin: 0.4 mg/dL (ref 0.2–1.2)
Total Protein: 7.2 g/dL (ref 6.0–8.3)

## 2015-06-05 LAB — CBC
HCT: 38.9 % (ref 36.0–46.0)
HEMOGLOBIN: 12.9 g/dL (ref 12.0–15.0)
MCHC: 33.2 g/dL (ref 30.0–36.0)
MCV: 84.8 fl (ref 78.0–100.0)
PLATELETS: 314 10*3/uL (ref 150.0–400.0)
RBC: 4.59 Mil/uL (ref 3.87–5.11)
RDW: 14.3 % (ref 11.5–15.5)
WBC: 6 10*3/uL (ref 4.0–10.5)

## 2015-06-05 LAB — T4, FREE: Free T4: 1.14 ng/dL (ref 0.60–1.60)

## 2015-06-05 LAB — TSH: TSH: 5.91 u[IU]/mL — AB (ref 0.35–4.50)

## 2015-06-05 NOTE — Telephone Encounter (Signed)
Pt returning your call. Please call before 5:00pm today.

## 2015-06-14 ENCOUNTER — Encounter: Payer: Self-pay | Admitting: Family Medicine

## 2015-06-14 DIAGNOSIS — R7989 Other specified abnormal findings of blood chemistry: Secondary | ICD-10-CM | POA: Insufficient documentation

## 2015-06-14 HISTORY — DX: Other specified abnormal findings of blood chemistry: R79.89

## 2015-06-14 NOTE — Assessment & Plan Note (Signed)
Free T4 normal, will continue to monitor

## 2015-06-14 NOTE — Assessment & Plan Note (Signed)
Patient encouraged to maintain heart healthy diet, regular exercise, adequate sleep. Consider daily probiotics. Take medications as prescribed. Labs ordered and reviewed. Given and reviewed copy of ACP documents from [place] Secretary of State and encouraged to complete and return 

## 2015-06-14 NOTE — Progress Notes (Signed)
Tara Greene  [id number] 1971-04-01 06/14/2015      Progress Note-Follow Up  Subjective  Chief Complaint  Chief Complaint  Patient presents with  . Annual Exam    HPI  Patient is a 44 y.o. female in today for routine medical care. Patient is in today for annual exam. Overall she is doing well. She's had some mild trouble with allergies but uses fexofenadine and Flonase with good results at times. Is complaining of some stiffness and discomfort in the right side of her neck and her right shoulder intermittently. No recent falls or trauma. No radicular symptoms. Worse after sleeping. Denies CP/palp/SOB/HA/congestion/fevers/GI or GU c/o. Taking meds as prescribed  Past Medical History  Diagnosis Date  . GERD (gastroesophageal reflux disease)   . Allergy   . Hyperlipidemia   . Anemia   . Other and unspecified hyperlipidemia 10/18/2013  . Dermatitis 10/18/2013  . Perimenopause 04/12/2014  . Abnormal TSH 06/14/2015    History reviewed. No pertinent past surgical history.  Family History  Problem Relation Age of Onset  . Hypertension Mother   . Hyperlipidemia Mother   . Cancer Mother     colon   . COPD Father     History   Social History  . Marital Status: Married    Spouse Name: N/A  . Number of Children: 3  . 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 Smoking status: Never Smoker   . Smokeless tobacco: Never Used  . Alcohol Use: No  . Drug Use: No  . 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 Not on file   Social History Narrative    Current Outpatient Prescriptions on File Prior to Visit  Medication Sig Dispense Refill  . Multiple Vitamin (MULTIVITAMIN) tablet Take 1 tablet by mouth daily.      . fexofenadine (ALLEGRA) 180 MG tablet Take 1 tablet (180 mg total) by mouth daily. (Patient not taking: Reported on  06/04/2015)    . fluticasone (FLONASE) 50 MCG/ACT nasal spray Place 1 spray into both nostrils daily. (Patient not taking: Reported on 06/04/2015)  2  . triamcinolone cream (KENALOG) 0.1 % Apply 1 application topically 2 (two) times daily. As needed for rash (Patient not taking: Reported on 06/04/2015) 85.2 g 2   No current facility-administered medications on file prior to visit.    No Known Allergies  Review of Systems  Review of Systems  Constitutional: Negative.  Negative for fever and malaise/fatigue.  HENT: Negative for congestion.   Eyes: Negative for discharge.  Respiratory: Negative for shortness of breath.   Cardiovascular: Negative for chest pain, palpitations and leg swelling.  Gastrointestinal: Negative for nausea, abdominal pain and diarrhea.  Genitourinary: Negative for dysuria.  Musculoskeletal: Positive for joint pain and neck pain. Negative for falls.       Right side, intermittent pain and tightness, no injury  Skin: Negative.  Negative for rash.  Neurological: Negative for loss of consciousness and headaches.  Endo/Heme/Allergies: Negative for polydipsia.  Psychiatric/Behavioral: Negative for depression and suicidal ideas. The patient is not nervous/anxious and does not have insomnia.     Objective  BP 104/72 mmHg  Pulse 83  Temp(Src) 98.8 F (37.1 C) (Oral)  Ht 5\' 1"  (1.549 m)  Wt 152 lb 6 oz (69.117 kg)  BMI 28.81 kg/m2  SpO2 97%  LMP 06/01/2015  Physical Exam  Physical Exam  Constitutional: She is oriented to person, place, and time and well-developed, well-nourished, and in no distress. No distress.  HENT:  Head: Normocephalic and atraumatic.  Right Ear: External ear normal.  Left Ear: External ear normal.  Nose: Nose normal.  Mouth/Throat: Oropharynx is clear and moist. No oropharyngeal exudate.  Eyes: Conjunctivae are normal. Pupils are equal, round, and reactive to light. Right eye exhibits no discharge. Left eye exhibits no discharge. No scleral  icterus.  Neck: Normal range of motion. Neck supple. No thyromegaly present.  Cardiovascular: Normal rate, regular rhythm, normal heart sounds and intact distal pulses.   No murmur heard. Pulmonary/Chest: Effort normal and breath sounds normal. No respiratory distress. She has no wheezes. She has no rales.  Abdominal: Soft. Bowel sounds are normal. She exhibits no distension and no mass. There is no tenderness.  Musculoskeletal: Normal range of motion. She exhibits no edema or tenderness.  Lymphadenopathy:    She has no cervical adenopathy.  Neurological: She is alert and oriented to person, place, and time. She has normal reflexes. No cranial nerve deficit. Coordination normal.  Skin: Skin is warm and dry. No rash noted. She is not diaphoretic.  Psychiatric: Mood, memory and affect normal.    Lab Results  Component Value Date   TSH 5.91* 06/04/2015   Lab Results  Component Value Date   WBC 6.0 06/04/2015   HGB 12.9 06/04/2015   HCT 38.9 06/04/2015   MCV 84.8 06/04/2015   PLT 314.0 06/04/2015   Lab Results  Component Value Date   CREATININE 0.76 06/04/2015   BUN 13 06/04/2015   NA 140 06/04/2015   K 3.5 06/04/2015   CL 102 06/04/2015   CO2 32 06/04/2015   Lab Results  Component Value Date   ALT 23 06/04/2015   AST 22 06/04/2015   ALKPHOS 70 06/04/2015   BILITOT 0.4 06/04/2015   Lab Results  Component Value Date   CHOL 183 06/04/2015   Lab Results  Component Value Date   HDL 42.90 06/04/2015   Lab Results  Component Value Date   LDLCALC 100* 06/04/2015   Lab Results  Component Value Date   TRIG 199.0* 06/04/2015   Lab Results  Component Value Date   CHOLHDL 4 06/04/2015     Assessment & Plan  Preventative health care Patient encouraged to maintain heart healthy diet, regular exercise, adequate sleep. Consider daily probiotics. Take medications as prescribed. Labs ordered and reviewed. Given and reviewed copy of ACP documents from [employer]  and encouraged to complete and return  Hyperlipidemia Encouraged heart healthy diet, increase exercise, avoid trans fats, consider a krill oil cap daily  Allergy May use Fexofenadine daily prn  Abnormal TSH Free T4 normal, will continue to monitor

## 2015-06-14 NOTE — Assessment & Plan Note (Signed)
May use Fexofenadine daily prn

## 2015-06-14 NOTE — Assessment & Plan Note (Signed)
Encouraged heart healthy diet, increase exercise, avoid trans fats, consider a krill oil cap daily 

## 2015-06-29 ENCOUNTER — Other Ambulatory Visit: Payer: Self-pay

## 2015-06-29 DIAGNOSIS — Z1231 Encounter for screening mammogram for malignant neoplasm of breast: Secondary | ICD-10-CM

## 2015-07-02 ENCOUNTER — Ambulatory Visit
Admission: RE | Admit: 2015-07-02 | Discharge: 2015-07-02 | Disposition: A | Discharge status: Home / Self Care | Payer: 59 | Source: Ambulatory Visit

## 2015-07-02 DIAGNOSIS — Z1231 Encounter for screening mammogram for malignant neoplasm of breast: Secondary | ICD-10-CM

## 2016-07-04 ENCOUNTER — Other Ambulatory Visit: Payer: Self-pay | Admitting: Family Medicine

## 2016-07-04 DIAGNOSIS — Z1231 Encounter for screening mammogram for malignant neoplasm of breast: Secondary | ICD-10-CM

## 2016-07-07 ENCOUNTER — Encounter: Payer: Self-pay | Admitting: Family

## 2016-07-07 ENCOUNTER — Ambulatory Visit (INDEPENDENT_AMBULATORY_CARE_PROVIDER_SITE_OTHER): Payer: 59 | Admitting: Family

## 2016-07-07 VITALS — BP 158/98 | HR 72 | Temp 98.4°F | Ht 61.0 in | Wt 153.2 lb

## 2016-07-07 DIAGNOSIS — I10 Essential (primary) hypertension: Secondary | ICD-10-CM

## 2016-07-07 DIAGNOSIS — K219 Gastro-esophageal reflux disease without esophagitis: Secondary | ICD-10-CM

## 2016-07-07 DIAGNOSIS — Z0000 Encounter for general adult medical examination without abnormal findings: Secondary | ICD-10-CM | POA: Diagnosis not present

## 2016-07-07 LAB — LIPID PANEL
CHOL/HDL RATIO: 5
Cholesterol: 210 mg/dL — ABNORMAL HIGH (ref 0–200)
HDL: 46.5 mg/dL (ref >39.00)
LDL CALC: 128 mg/dL — AB (ref 0–99)
NONHDL: 163.99
TRIGLYCERIDES: 178 mg/dL — AB (ref 0.0–149.0)
VLDL: 35.6 mg/dL (ref 0.0–40.0)

## 2016-07-07 LAB — HEPATIC FUNCTION PANEL
ALT: 28 U/L (ref 0–35)
AST: 22 U/L (ref 0–37)
Albumin: 4.2 g/dL (ref 3.5–5.2)
Alkaline Phosphatase: 75 U/L (ref 39–117)
BILIRUBIN TOTAL: 0.6 mg/dL (ref 0.2–1.2)
Bilirubin, Direct: 0.1 mg/dL (ref 0.0–0.3)
Total Protein: 7.7 g/dL (ref 6.0–8.3)

## 2016-07-07 LAB — CBC WITH DIFFERENTIAL/PLATELET
BASOS PCT: 0.6 % (ref 0.0–3.0)
Basophils Absolute: 0 10*3/uL (ref 0.0–0.1)
EOS PCT: 3.3 % (ref 0.0–5.0)
Eosinophils Absolute: 0.2 10*3/uL (ref 0.0–0.7)
HCT: 39.2 % (ref 36.0–46.0)
Hemoglobin: 13.2 g/dL (ref 12.0–15.0)
LYMPHS ABS: 2 10*3/uL (ref 0.7–4.0)
Lymphocytes Relative: 29.8 % (ref 12.0–46.0)
MCHC: 33.7 g/dL (ref 30.0–36.0)
MCV: 82.2 fl (ref 78.0–100.0)
MONO ABS: 0.4 10*3/uL (ref 0.1–1.0)
Monocytes Relative: 5.5 % (ref 3.0–12.0)
NEUTROS ABS: 4 10*3/uL (ref 1.4–7.7)
NEUTROS PCT: 60.8 % (ref 43.0–77.0)
Platelets: 341 10*3/uL (ref 150.0–400.0)
RBC: 4.77 Mil/uL (ref 3.87–5.11)
RDW: 13.7 % (ref 11.5–15.5)
WBC: 6.6 10*3/uL (ref 4.0–10.5)

## 2016-07-07 LAB — BASIC METABOLIC PANEL
BUN: 13 mg/dL (ref 6–23)
CHLORIDE: 102 meq/L (ref 96–112)
CO2: 32 mEq/L (ref 19–32)
Calcium: 9.6 mg/dL (ref 8.4–10.5)
Creatinine, Ser: 0.73 mg/dL (ref 0.40–1.20)
GFR: 90.63 mL/min (ref >60.00)
GLUCOSE: 89 mg/dL (ref 70–99)
POTASSIUM: 3.5 meq/L (ref 3.5–5.1)
Sodium: 139 mEq/L (ref 135–145)

## 2016-07-07 LAB — TSH: TSH: 2.21 u[IU]/mL (ref 0.35–4.50)

## 2016-07-07 MED ORDER — HYDROCHLOROTHIAZIDE 25 MG PO TABS: 25.0000 mg | ORAL_TABLET | Freq: Every day | ORAL | 3 refills | Status: DC

## 2016-07-07 MED ORDER — OMEPRAZOLE 40 MG PO CPDR: 40.0000 mg | DELAYED_RELEASE_CAPSULE | Freq: Every day | ORAL | 3 refills | Status: DC

## 2016-07-07 MED FILL — OMEPRAZOLE DR 40 MG CAPSULE: 40 | 30 days supply | Qty: 30 | Fill #0

## 2016-07-07 MED FILL — HYDROCHLOROTHIAZIDE 25 MG T: 25 | 90 days supply | Qty: 90 | Fill #0

## 2016-07-07 NOTE — Progress Notes (Signed)
Pre visit review using our clinic tool,if applicable. No additional management support is needed unless otherwise documented below in the visit note.  

## 2016-07-07 NOTE — Progress Notes (Signed)
Subjective:    Patient ID: Kennith Center, female    DOB: [birth year], 45 y.o.   MRN: [id number]  HPI  Ms. Urioste is a 45 yr old female who presents today for cpx.  Immunizations: Tetanus is up to date Diet: healthy Exercise: no Pap Smear:  04/07/14, normal Mammogram: due, scheduled Vision: due Dental: up to date  Dysphagia- see below.      Review of Systems  Constitutional:       Wt Readings from Last 3 Encounters: 07/07/16 : 153 lb 3.2 oz (69.5 kg) 06/04/15 : 152 lb 6 oz (69.1 kg) 04/07/14 : 152 lb 0.6 oz (69 kg)  Has lost 7 pounds  HENT: Negative for hearing loss and rhinorrhea.   Eyes: Negative for visual disturbance.       Needs reading glasses  Respiratory: Negative for cough.   Cardiovascular: Negative for leg swelling.  Gastrointestinal: Negative for constipation and diarrhea.       Some dysphagia- feels like it does go all the way down. Solid.  Some reflux   Genitourinary: Negative for dysuria and frequency.  Musculoskeletal:       Some right shoulder pain.  Occasional ibuprofen, declines further eval at this time  Neurological:       Rare headaches  Hematological: Negative for adenopathy.  Psychiatric/Behavioral:       Denies depression/anxiety   Past Medical History:  Diagnosis Date  . Abnormal TSH 06/14/2015  . Allergy   . Anemia   . Dermatitis 10/18/2013  . GERD (gastroesophageal reflux disease)   . Hyperlipidemia   . Other and unspecified hyperlipidemia 10/18/2013  . Perimenopause 04/12/2014     Social History   Social History  . Marital status: Married    Spouse name: N/A  . Number of children: 3  . 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 Smoking status: Never Smoker  . Smokeless tobacco: Never Used  . Alcohol use No  . Drug use: No  . 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 Not on file     Social History Narrative  . No narrative on file    No past surgical history on file.  Family History  Problem Relation Age of Onset  . Hypertension Mother   . Hyperlipidemia Mother   . Cancer Mother     colon   . COPD Father     No Known Allergies  Current Outpatient Prescriptions on File Prior to Visit  Medication Sig Dispense Refill  . fexofenadine (ALLEGRA) 180 MG tablet Take 1 tablet (180 mg total) by mouth daily.    . Multiple Vitamin (MULTIVITAMIN) tablet Take 1 tablet by mouth daily.      . fluticasone (FLONASE) 50 MCG/ACT nasal spray Place 1 spray into both nostrils daily. (Patient not taking: Reported on 07/07/2016)  2  . triamcinolone cream (KENALOG) 0.1 % Apply 1 application topically 2 (two) times daily. As needed for rash (Patient not taking: Reported on 06/04/2015) 85.2 g 2   No current facility-administered medications on file prior to visit.     BP (!) 158/98   Pulse 72   Temp 98.4 F (36.9 C) (Oral)   Ht 5\' 1"  (1.549 m)   Wt 153 lb 3.2 oz (69.5 kg)   LMP 01/08/2016 Comment: Irregular per patient  SpO2 100%   BMI 28.95 kg/m       Objective:   Physical Exam   Physical Exam  Constitutional: She is oriented to person, place, and time. She appears well-developed and well-nourished. No distress.  HENT:  Head: Normocephalic and atraumatic.  Right Ear: Tympanic membrane and ear canal normal.  Left Ear: Tympanic membrane and ear canal normal.  Mouth/Throat: Oropharynx is clear and moist.  Eyes: Pupils are equal, round, and reactive to light. No scleral icterus.  Neck: Normal range of motion. No thyromegaly present.  Cardiovascular: Normal rate and regular rhythm.   No murmur heard. Pulmonary/Chest: Effort normal and breath sounds normal. No respiratory distress. He has no wheezes. She has no rales. She exhibits no tenderness.  Abdominal: Soft. Bowel sounds are normal. She exhibits no distension and no mass. There is no tenderness. There is no rebound  and no guarding.  Musculoskeletal: She exhibits no edema.  Lymphadenopathy:    She has no cervical adenopathy.  Neurological: She is alert and oriented to person, place, and time. She has normal patellar reflexes. She exhibits normal muscle tone. Coordination normal.  Skin: Skin is warm and dry.  Psychiatric: She has a normal mood and affect. Her behavior is normal. Judgment and thought content normal.  Breasts: Examined lying Right: Without masses, retractions, discharge or axillary adenopathy.  Left: Without masses, retractions, discharge or axillary adenopathy.  Pelvic: deferred     Assessment & Plan:        Assessment & Plan:  Preventative Care- EKG tracing is personally reviewed.  EKG notes NSR.  No acute changes. Obtain routine lab work.  Discussed adding 30 min of walking 5 days a week.

## 2016-07-07 NOTE — Patient Instructions (Addendum)
Please begin hctz once daily for blood pressure. Begin prilosec 40mg  once daily for reflux.  Try to add 30 minutes of walking 5 days a week.   Food Choices for Gastroesophageal Reflux Disease, Adult When you have gastroesophageal reflux disease (GERD), the foods you eat and your eating habits are very important. Choosing the right foods can help ease the discomfort of GERD. WHAT GENERAL GUIDELINES DO I NEED TO FOLLOW?  Choose fruits, vegetables, whole grains, low-fat dairy products, and low-fat meat, fish, and poultry.  Limit fats such as oils, salad dressings, butter, nuts, and avocado.  Keep a food diary to identify foods that cause symptoms.  Avoid foods that cause reflux. These may be different for different people.  Eat frequent small meals instead of three large meals each day.  Eat your meals slowly, in a relaxed setting.  Limit fried foods.  Cook foods using methods other than frying.  Avoid drinking alcohol.  Avoid drinking large amounts of liquids with your meals.  Avoid bending over or lying down until 2-3 hours after eating. WHAT FOODS ARE NOT RECOMMENDED? The following are some foods and drinks that may worsen your symptoms: Vegetables Tomatoes. Tomato juice. Tomato and spaghetti sauce. Chili peppers. Onion and garlic. Horseradish. Fruits Oranges, grapefruit, and lemon (fruit and juice). Meats High-fat meats, fish, and poultry. This includes hot dogs, ribs, ham, sausage, salami, and bacon. Dairy Whole milk and chocolate milk. Sour cream. Cream. Butter. Ice cream. Cream cheese.  Beverages Coffee and tea, with or without caffeine. Carbonated beverages or energy drinks. Condiments Hot sauce. Barbecue sauce.  Sweets/Desserts Chocolate and cocoa. Donuts. Peppermint and spearmint. Fats and Oils High-fat foods, including [place] fries and potato chips. Other Vinegar. Strong spices, such as black pepper, white pepper, red pepper, cayenne, curry powder, cloves,  ginger, and chili powder. The items listed above may not be a complete list of foods and beverages to avoid. Contact your dietitian for more information.   This information is not intended to replace advice given to you by your health care provider. Make sure you discuss any questions you have with your health care provider.   Document Released: 11/14/2005 Document Revised: 12/05/2014 Document Reviewed: 09/18/2013 Elsevier Interactive Patient Education [employer].

## 2016-07-08 ENCOUNTER — Encounter: Payer: Self-pay | Admitting: Family

## 2016-07-10 ENCOUNTER — Encounter: Payer: Self-pay | Admitting: Family

## 2016-07-10 DIAGNOSIS — K219 Gastro-esophageal reflux disease without esophagitis: Secondary | ICD-10-CM | POA: Insufficient documentation

## 2016-07-10 DIAGNOSIS — I10 Essential (primary) hypertension: Secondary | ICD-10-CM | POA: Insufficient documentation

## 2016-07-10 NOTE — Assessment & Plan Note (Signed)
Suspect dysphagia sxs due to gerd. Trial of PPI. If symptoms worsen or do not improve, will plan referral to GI.

## 2016-07-10 NOTE — Assessment & Plan Note (Signed)
New. Will initiate hctz once daily.

## 2016-07-20 ENCOUNTER — Ambulatory Visit: Payer: 59

## 2016-07-25 ENCOUNTER — Encounter: Payer: Self-pay | Admitting: Family

## 2016-07-25 ENCOUNTER — Ambulatory Visit (INDEPENDENT_AMBULATORY_CARE_PROVIDER_SITE_OTHER): Payer: 59 | Admitting: Family

## 2016-07-25 ENCOUNTER — Telehealth: Payer: Self-pay | Admitting: Family

## 2016-07-25 VITALS — BP 131/87 | HR 79 | Temp 98.8°F | Resp 17 | Ht 61.0 in | Wt 151.2 lb

## 2016-07-25 DIAGNOSIS — K219 Gastro-esophageal reflux disease without esophagitis: Secondary | ICD-10-CM | POA: Diagnosis not present

## 2016-07-25 DIAGNOSIS — I10 Essential (primary) hypertension: Secondary | ICD-10-CM | POA: Diagnosis not present

## 2016-07-25 DIAGNOSIS — E876 Hypokalemia: Secondary | ICD-10-CM

## 2016-07-25 LAB — BASIC METABOLIC PANEL
BUN: 11 mg/dL (ref 6–23)
CALCIUM: 9.5 mg/dL (ref 8.4–10.5)
CO2: 33 meq/L — AB (ref 19–32)
Chloride: 96 mEq/L (ref 96–112)
Creatinine, Ser: 0.85 mg/dL (ref 0.40–1.20)
GFR: 76.02 mL/min (ref >60.00)
GLUCOSE: 104 mg/dL — AB (ref 70–99)
Potassium: 3.1 mEq/L — ABNORMAL LOW (ref 3.5–5.1)
SODIUM: 138 meq/L (ref 135–145)

## 2016-07-25 MED ORDER — POTASSIUM CHLORIDE CRYS ER 20 MEQ PO TBCR: 20.0000 meq | EXTENDED_RELEASE_TABLET | Freq: Every day | ORAL | 3 refills | Status: DC

## 2016-07-25 NOTE — Patient Instructions (Signed)
Please complete lab work prior to leaving.   

## 2016-07-25 NOTE — Assessment & Plan Note (Signed)
Improved. Discussed continuing PPI daily for another 2 weeks, then try to wean off to see how she does. If recurrent gerd sxs, restart daily omeprazole.

## 2016-07-25 NOTE — Progress Notes (Signed)
Pre visit review using our clinic review tool, if applicable. No additional management support is needed unless otherwise documented below in the visit note. 

## 2016-07-25 NOTE — Assessment & Plan Note (Addendum)
Improved. Continue hctz, discussed diet, exercise and weight loss.  Obtain follow up bmet.

## 2016-07-25 NOTE — Progress Notes (Signed)
Subjective:    Patient ID: Tara Greene, female    DOB: 16-Dec-1970, 46 y.o.   MRN: [id number]  HPI  Tara Greene is a 45 yr old female who presents today for follow up of her dysphagia and HTN.  She was placed on PPI last visit.  She reports that her symptoms are much better.  Reports resolution of AM nausea and resolution of dysphagia.  HTN-  Last visit BP was elevated. Pt was placed on hctz.  Denies CP/SOB or swelling.  BP Readings from Last 3 Encounters:  07/25/16 131/87  07/07/16 (!) 158/98  06/04/15 104/72     Review of Systems See HPI  Past Medical History:  Diagnosis Date  . Abnormal TSH 06/14/2015  . Allergy   . Anemia   . Dermatitis 10/18/2013  . GERD (gastroesophageal reflux disease)   . Hyperlipidemia   . Other and unspecified hyperlipidemia 10/18/2013  . Perimenopause 04/12/2014     Social History   Social History  . Marital status: Married    Spouse name: N/A  . Number of children: 3  . 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 Smoking status: Never Smoker  . Smokeless tobacco: Never Used  . Alcohol use No  . Drug use: No  . 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 Not on file   Social History Narrative  . No narrative on file    No past surgical history on file.  Family History  Problem Relation Age of Onset  . Hypertension Mother   . Hyperlipidemia Mother   . Cancer Mother 7    colon   . COPD Father     No Known Allergies  Current Outpatient Prescriptions on File Prior to Visit  Medication Sig Dispense Refill  . fexofenadine (ALLEGRA) 180 MG tablet Take 1 tablet (180 mg total) by mouth daily.    . fluticasone (FLONASE) 50 MCG/ACT nasal spray Place 1 spray into both nostrils daily.  2  . hydrochlorothiazide (HYDRODIURIL) 25 MG tablet Take 1 tablet (25 mg total) by mouth daily. 30 tablet 3    . Multiple Vitamin (MULTIVITAMIN) tablet Take 1 tablet by mouth daily.      Marland Kitchen omeprazole (PRILOSEC) 40 MG capsule Take 1 capsule (40 mg total) by mouth daily. 30 capsule 3  . triamcinolone cream (KENALOG) 0.1 % Apply 1 application topically 2 (two) times daily. As needed for rash 85.2 g 2   No current facility-administered medications on file prior to visit.     BP 131/87 (BP Location: Left Arm, Patient Position: Sitting)   Pulse 79   Temp 98.8 F (37.1 C) (Oral)   Resp 17   Ht 5\' 1"  (1.549 m)   Wt 151 lb 3.2 oz (68.6 kg)   LMP 01/08/2016 Comment: Irregular per patient  SpO2 100%   BMI 28.57 kg/m       Objective:   Physical Exam  Constitutional: She is oriented to person, place, and time. She appears well-developed and well-nourished.  HENT:  Head: Normocephalic and atraumatic.  Cardiovascular: Normal rate, regular rhythm and normal heart sounds.   No murmur heard. Pulmonary/Chest: Effort normal and breath sounds normal. No respiratory distress. She has no wheezes.  Musculoskeletal: She exhibits no edema.  Neurological: She is alert and oriented to person, place,  and time.  Psychiatric: She has a normal mood and affect. Her behavior is normal. Judgment and thought content normal.          Assessment & Plan:

## 2016-07-25 NOTE — Telephone Encounter (Signed)
K+ is low. Advise pt to begin kdur, take 2 tabs by mouth today, then one tab once daily. Repeat bmet in 1 week, dx hypokalemia.

## 2016-07-26 ENCOUNTER — Ambulatory Visit
Admission: RE | Admit: 2016-07-26 | Discharge: 2016-07-26 | Disposition: A | Discharge status: Home / Self Care | Payer: 59 | Source: Ambulatory Visit | Attending: Family Medicine | Admitting: Family Medicine

## 2016-07-26 DIAGNOSIS — Z1231 Encounter for screening mammogram for malignant neoplasm of breast: Secondary | ICD-10-CM | POA: Diagnosis not present

## 2016-07-26 MED FILL — POTASSIUM CL ER 20 MEQ TABL: 20 | 30 days supply | Qty: 30 | Fill #0

## 2016-07-26 NOTE — Telephone Encounter (Signed)
Notified pt and scheduled lab appt for 08/02/16 at 7:30am. Future order entered.

## 2016-07-26 NOTE — Telephone Encounter (Signed)
Left message for pt to return my call.

## 2016-08-02 ENCOUNTER — Other Ambulatory Visit (INDEPENDENT_AMBULATORY_CARE_PROVIDER_SITE_OTHER): Payer: 59

## 2016-08-02 ENCOUNTER — Telehealth: Payer: Self-pay | Admitting: Family

## 2016-08-02 DIAGNOSIS — E876 Hypokalemia: Secondary | ICD-10-CM

## 2016-08-02 LAB — BASIC METABOLIC PANEL
BUN: 13 mg/dL (ref 6–23)
CO2: 33 mEq/L — ABNORMAL HIGH (ref 19–32)
Calcium: 9.1 mg/dL (ref 8.4–10.5)
Chloride: 100 mEq/L (ref 96–112)
Creatinine, Ser: 0.92 mg/dL (ref 0.40–1.20)
GFR: 69.38 mL/min (ref >60.00)
Glucose, Bld: 105 mg/dL — ABNORMAL HIGH (ref 70–99)
POTASSIUM: 3.1 meq/L — AB (ref 3.5–5.1)
SODIUM: 140 meq/L (ref 135–145)

## 2016-08-04 NOTE — Telephone Encounter (Signed)
See mychart message 9/5

## 2016-08-09 ENCOUNTER — Other Ambulatory Visit (INDEPENDENT_AMBULATORY_CARE_PROVIDER_SITE_OTHER): Payer: 59

## 2016-08-09 ENCOUNTER — Telehealth: Payer: Self-pay | Admitting: Family

## 2016-08-09 DIAGNOSIS — E876 Hypokalemia: Secondary | ICD-10-CM | POA: Diagnosis not present

## 2016-08-09 LAB — BASIC METABOLIC PANEL
BUN: 15 mg/dL (ref 6–23)
CO2: 34 mEq/L — ABNORMAL HIGH (ref 19–32)
CREATININE: 0.9 mg/dL (ref 0.40–1.20)
Calcium: 9.2 mg/dL (ref 8.4–10.5)
Chloride: 98 mEq/L (ref 96–112)
GFR: 71.15 mL/min (ref >60.00)
Glucose, Bld: 97 mg/dL (ref 70–99)
POTASSIUM: 3.2 meq/L — AB (ref 3.5–5.1)
Sodium: 137 mEq/L (ref 135–145)

## 2016-08-09 MED ORDER — AMLODIPINE BESYLATE 5 MG PO TABS: 5.0000 mg | ORAL_TABLET | Freq: Every day | ORAL | 2 refills | Status: DC

## 2016-08-09 MED FILL — AMLODIPINE BESYLATE 5 MG TA: 5 | 30 days supply | Qty: 30 | Fill #0

## 2016-08-09 NOTE — Telephone Encounter (Signed)
Notified pt and she voices understanding. Nurse visit scheduled for 08/23/16 at 9:45am and lab order has been entered.

## 2016-08-09 NOTE — Telephone Encounter (Signed)
Left message for patient to call office back PC

## 2016-08-09 NOTE — Telephone Encounter (Signed)
Potassium is still low.  I would recommend that we change her blood pressure medication.  D/c hctz.  Begin amlodipine.   Take 2 additional tablets of Kdur today then stop kdur.   Follow up in 2 weeks for nurse visit- bp check and follow up bmet. (dx hypokalemia).

## 2016-08-10 MED FILL — OMEPRAZOLE DR 40 MG CAPSULE: 40 | 30 days supply | Qty: 30 | Fill #1

## 2016-08-23 ENCOUNTER — Ambulatory Visit (INDEPENDENT_AMBULATORY_CARE_PROVIDER_SITE_OTHER): Payer: 59 | Admitting: Family

## 2016-08-23 VITALS — BP 156/88 | HR 72

## 2016-08-23 DIAGNOSIS — I10 Essential (primary) hypertension: Secondary | ICD-10-CM | POA: Diagnosis not present

## 2016-08-23 DIAGNOSIS — E876 Hypokalemia: Secondary | ICD-10-CM

## 2016-08-23 LAB — BASIC METABOLIC PANEL
BUN: 11 mg/dL (ref 6–23)
CHLORIDE: 103 meq/L (ref 96–112)
CO2: 31 meq/L (ref 19–32)
Calcium: 9.3 mg/dL (ref 8.4–10.5)
Creatinine, Ser: 0.75 mg/dL (ref 0.40–1.20)
GFR: 87.8 mL/min (ref >60.00)
Glucose, Bld: 86 mg/dL (ref 70–99)
Potassium: 3.5 mEq/L (ref 3.5–5.1)
SODIUM: 142 meq/L (ref 135–145)

## 2016-08-23 MED ORDER — AMLODIPINE BESYLATE 10 MG PO TABS: 10.0000 mg | ORAL_TABLET | Freq: Every day | ORAL | 1 refills | Status: DC

## 2016-08-23 MED FILL — AMLODIPINE BESYLATE 10 MG T: 10 | 30 days supply | Qty: 30 | Fill #0

## 2016-08-23 NOTE — Patient Instructions (Signed)
Per Inda Castle: Increase Amlodipine to 10 MG once daily. Complete BMET lab work today and return in 1 month for an office visit with PCP.

## 2016-08-23 NOTE — Progress Notes (Signed)
Noted and agree. 

## 2016-08-23 NOTE — Progress Notes (Signed)
Pre visit review using our clinic review tool, if applicable. No additional management support is needed unless otherwise documented below in the visit note.  Patient in office today for blood pressure check per Telephone Note 08/09/16. Reviewed medication & current regimen with the patient. Today's readings were as follow: BP 142/88 P 70 & BP 156/88 P 72.  Per Inda Castle: Increase Amlodipine to 10 MG once daily. Complete BMET lab work today and return in 1 month for an office visit with PCP.  Informed patient of the provider's instructions. She voiced understanding and did not address any concerns prior to leaving the nurse visit.  Next appointment scheduled for 09/27/16 at 10:30 AM.

## 2016-09-14 DIAGNOSIS — H524 Presbyopia: Secondary | ICD-10-CM | POA: Diagnosis not present

## 2016-09-27 ENCOUNTER — Ambulatory Visit (INDEPENDENT_AMBULATORY_CARE_PROVIDER_SITE_OTHER): Payer: 59 | Admitting: Family

## 2016-09-27 ENCOUNTER — Encounter: Payer: Self-pay | Admitting: Family

## 2016-09-27 VITALS — BP 118/75 | HR 71 | Temp 98.1°F | Resp 16 | Ht 61.0 in | Wt 154.2 lb

## 2016-09-27 DIAGNOSIS — K219 Gastro-esophageal reflux disease without esophagitis: Secondary | ICD-10-CM

## 2016-09-27 DIAGNOSIS — L309 Dermatitis, unspecified: Secondary | ICD-10-CM

## 2016-09-27 DIAGNOSIS — I10 Essential (primary) hypertension: Secondary | ICD-10-CM | POA: Diagnosis not present

## 2016-09-27 DIAGNOSIS — R42 Dizziness and giddiness: Secondary | ICD-10-CM

## 2016-09-27 MED ORDER — TRIAMCINOLONE ACETONIDE 0.1 % EX CREA: 1.0000 "application " | TOPICAL_CREAM | Freq: Two times a day (BID) | CUTANEOUS | 2 refills | Status: DC

## 2016-09-27 MED FILL — OMEPRAZOLE DR 40 MG CAPSULE: 40 | 30 days supply | Qty: 30 | Fill #2

## 2016-09-27 MED FILL — TRIAMCINOLONE 0.1% CREAM: 0.1 | 30 days supply | Qty: 80 | Fill #0

## 2016-09-27 MED FILL — AMLODIPINE BESYLATE 10 MG T: 10 | 30 days supply | Qty: 30 | Fill #1

## 2016-09-27 NOTE — Progress Notes (Signed)
Subjective:    Patient ID: Tara Greene, female    DOB: Jan 13, 1971, 45 y.o.   MRN: [id number]  HPI  Tara Greene is a 45 yr old female who presents today for follow up.  1) HTN- maintained on amlodipine.  BP Readings from Last 3 Encounters:  09/27/16 118/75  08/23/16 (!) 156/88  07/25/16 131/87   2) GERD-  On omeprazole prn.  Use prn and reports that his is well controlled.   3) Dizziness- reports that dizziness is intermittent and has been going on "for a while."  Not really bothering her that much. Does not seem to be related her allergies.  Happens sometimes in the AM. Doesn't last that long.   4) Eczema- on the back of her neck.    Review of Systems    see HPI  Past Medical History:  Diagnosis Date  . Abnormal TSH 06/14/2015  . Allergy   . Anemia   . Dermatitis 10/18/2013  . GERD (gastroesophageal reflux disease)   . Hyperlipidemia   . Other and unspecified hyperlipidemia 10/18/2013  . Perimenopause 04/12/2014     Social History   Social History  . Marital status: Married    Spouse name: N/A  . Number of children: 3  . 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 Smoking status: Never Smoker  . Smokeless tobacco: Never Used  . Alcohol use No  . Drug use: No  . 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 Not on file   Social History Narrative  . No narrative on file    No past surgical history on file.  Family History  Problem Relation Age of Onset  . Hypertension Mother   . Hyperlipidemia Mother   . Cancer Mother 65    colon   . COPD Father     No Known Allergies  Current Outpatient Prescriptions on File Prior to Visit  Medication Sig Dispense Refill  . amLODipine (NORVASC) 10 MG tablet Take 1 tablet (10 mg total) by mouth daily. 30 tablet 1  . fexofenadine (ALLEGRA) 180 MG tablet Take 1 tablet (180 mg  total) by mouth daily.    . fluticasone (FLONASE) 50 MCG/ACT nasal spray Place 1 spray into both nostrils daily.  2  . Multiple Vitamin (MULTIVITAMIN) tablet Take 1 tablet by mouth daily.      Marland Kitchen omeprazole (PRILOSEC) 40 MG capsule Take 1 capsule (40 mg total) by mouth daily. 30 capsule 3   No current facility-administered medications on file prior to visit.     BP 118/75 (BP Location: Left Arm, Patient Position: Sitting, Cuff Size: Large)   Pulse 71   Temp 98.1 F (36.7 C) (Oral)   Resp 16   Ht 5\' 1"  (1.549 m)   Wt 154 lb 3.2 oz (69.9 kg)   LMP 01/08/2016 Comment: Irregular per patient  SpO2 100% Comment: RA  BMI 29.14 kg/m    Objective:   Physical Exam  Constitutional: She appears well-developed and well-nourished.  HENT:  Right Ear: Tympanic membrane and ear canal normal.  Left Ear: Tympanic membrane and ear canal normal.  Mouth/Throat: No oropharyngeal exudate, posterior oropharyngeal edema or posterior oropharyngeal erythema.  Cardiovascular: Normal rate, regular rhythm and normal heart sounds.   No murmur heard. Pulmonary/Chest: Effort normal and breath sounds normal.  No respiratory distress. She has no wheezes.  Skin:  Small raised dry skin patch left lateral neck.  Psychiatric: She has a normal mood and affect. Her behavior is normal. Judgment and thought content normal.          Assessment & Plan:  Dizziness- mild.  Normal ear exam.  Monitor for now.

## 2016-09-27 NOTE — Assessment & Plan Note (Signed)
Refill provided for her steroid cream.

## 2016-09-27 NOTE — Assessment & Plan Note (Signed)
Stable on amlodipine 

## 2016-09-27 NOTE — Assessment & Plan Note (Signed)
Stable with prn omeprazole, continue same.

## 2016-09-27 NOTE — Progress Notes (Signed)
Pre visit review using our clinic review tool, if applicable. No additional management support is needed unless otherwise documented below in the visit note. 

## 2016-10-27 ENCOUNTER — Other Ambulatory Visit: Payer: Self-pay | Admitting: Family

## 2016-10-27 MED FILL — AMLODIPINE BESYLATE 10 MG T: 10 | 90 days supply | Qty: 90 | Fill #0

## 2016-10-27 NOTE — Telephone Encounter (Signed)
Medication filled to pharmacy as requested.   

## 2017-01-27 ENCOUNTER — Ambulatory Visit (INDEPENDENT_AMBULATORY_CARE_PROVIDER_SITE_OTHER): Payer: 59 | Admitting: Family Medicine

## 2017-01-27 ENCOUNTER — Encounter (INDEPENDENT_AMBULATORY_CARE_PROVIDER_SITE_OTHER): Payer: Self-pay

## 2017-01-27 ENCOUNTER — Encounter: Payer: Self-pay | Admitting: Family Medicine

## 2017-01-27 DIAGNOSIS — D509 Iron deficiency anemia, unspecified: Secondary | ICD-10-CM

## 2017-01-27 DIAGNOSIS — I10 Essential (primary) hypertension: Secondary | ICD-10-CM

## 2017-01-27 DIAGNOSIS — E860 Dehydration: Secondary | ICD-10-CM | POA: Diagnosis not present

## 2017-01-27 DIAGNOSIS — R7989 Other specified abnormal findings of blood chemistry: Secondary | ICD-10-CM

## 2017-01-27 DIAGNOSIS — R946 Abnormal results of thyroid function studies: Secondary | ICD-10-CM | POA: Diagnosis not present

## 2017-01-27 DIAGNOSIS — E782 Mixed hyperlipidemia: Secondary | ICD-10-CM | POA: Diagnosis not present

## 2017-01-27 HISTORY — DX: Dehydration: E86.0

## 2017-01-27 LAB — COMPREHENSIVE METABOLIC PANEL
ALK PHOS: 85 U/L (ref 39–117)
ALT: 22 U/L (ref 0–35)
AST: 18 U/L (ref 0–37)
Albumin: 4.5 g/dL (ref 3.5–5.2)
BILIRUBIN TOTAL: 0.6 mg/dL (ref 0.2–1.2)
BUN: 11 mg/dL (ref 6–23)
CALCIUM: 9.6 mg/dL (ref 8.4–10.5)
CO2: 30 mEq/L (ref 19–32)
Chloride: 103 mEq/L (ref 96–112)
Creatinine, Ser: 0.75 mg/dL (ref 0.40–1.20)
GFR: 87.64 mL/min (ref >60.00)
Glucose, Bld: 85 mg/dL (ref 70–99)
POTASSIUM: 3.6 meq/L (ref 3.5–5.1)
Sodium: 140 mEq/L (ref 135–145)
TOTAL PROTEIN: 8.1 g/dL (ref 6.0–8.3)

## 2017-01-27 LAB — CBC
HEMATOCRIT: 41.4 % (ref 36.0–46.0)
HEMOGLOBIN: 13.9 g/dL (ref 12.0–15.0)
MCHC: 33.7 g/dL (ref 30.0–36.0)
MCV: 81.9 fl (ref 78.0–100.0)
PLATELETS: 374 10*3/uL (ref 150.0–400.0)
RBC: 5.06 Mil/uL (ref 3.87–5.11)
RDW: 13.4 % (ref 11.5–15.5)
WBC: 4.8 10*3/uL (ref 4.0–10.5)

## 2017-01-27 LAB — TSH: TSH: 1.55 u[IU]/mL (ref 0.35–4.50)

## 2017-01-27 LAB — LIPID PANEL
CHOLESTEROL: 214 mg/dL — AB (ref 0–200)
HDL: 50 mg/dL (ref >39.00)
LDL Cholesterol: 137 mg/dL — ABNORMAL HIGH (ref 0–99)
NonHDL: 164.34
TRIGLYCERIDES: 137 mg/dL (ref 0.0–149.0)
Total CHOL/HDL Ratio: 4
VLDL: 27.4 mg/dL (ref 0.0–40.0)

## 2017-01-27 LAB — VITAMIN D 25 HYDROXY (VIT D DEFICIENCY, FRACTURES): VITD: 25.44 ng/mL — ABNORMAL LOW (ref 30.00–100.00)

## 2017-01-27 MED ORDER — AMLODIPINE BESYLATE 10 MG PO TABS: 10.0000 mg | ORAL_TABLET | Freq: Every day | ORAL | 1 refills | Status: DC

## 2017-01-27 MED ORDER — OMEPRAZOLE 40 MG PO CPDR: 40.0000 mg | DELAYED_RELEASE_CAPSULE | Freq: Every day | ORAL | 3 refills | Status: DC

## 2017-01-27 MED FILL — AMLODIPINE BESYLATE 10 MG T: 10 | 90 days supply | Qty: 90 | Fill #0

## 2017-01-27 MED FILL — OMEPRAZOLE DR 40 MG CAPSULE: 40 | 30 days supply | Qty: 30 | Fill #0

## 2017-01-27 NOTE — Progress Notes (Signed)
Pre visit review using our clinic review tool, if applicable. No additional management support is needed unless otherwise documented below in the visit note. 

## 2017-01-27 NOTE — Assessment & Plan Note (Signed)
Well controlled, no changes to meds. Encouraged heart healthy diet such as the DASH diet and exercise as tolerated.  °

## 2017-01-27 NOTE — Assessment & Plan Note (Signed)
Encouraged heart healthy diet, increase exercise, avoid trans fats, consider a krill oil cap daily 

## 2017-01-27 NOTE — Assessment & Plan Note (Signed)
Recheck TSH today.  

## 2017-01-27 NOTE — Assessment & Plan Note (Signed)
Check cbc today 

## 2017-01-27 NOTE — Patient Instructions (Addendum)
64 ounces of water a day  Protein  Dehydration, Adult Dehydration is when there is not enough fluid or water in your body. This happens when you lose more fluids than you take in. Dehydration can range from mild to very bad. It should be treated right away to keep it from getting very bad. Symptoms of mild dehydration may include:   Thirst.  Dry lips.  Slightly dry mouth.  Dry, warm skin.  Dizziness. Symptoms of moderate dehydration may include:   Very dry mouth.  Muscle cramps.  Dark pee (urine). Pee may be the color of tea.  Your body making less pee.  Your eyes making fewer tears.  Heartbeat that is uneven or faster than normal (palpitations).  Headache.  Light-headedness, especially when you stand up from sitting.  Fainting (syncope). Symptoms of very bad dehydration may include:   Changes in skin, such as:  Cold and clammy skin.  Blotchy (mottled) or pale skin.  Skin that does not quickly return to normal after being lightly pinched and let go (poor skin turgor).  Changes in body fluids, such as:  Feeling very thirsty.  Your eyes making fewer tears.  Not sweating when body temperature is high, such as in hot weather.  Your body making very little pee.  Changes in vital signs, such as:  Weak pulse.  Pulse that is more than 100 beats a minute when you are sitting still.  Fast breathing.  Low blood pressure.  Other changes, such as:  Sunken eyes.  Cold hands and feet.  Confusion.  Lack of energy (lethargy).  Trouble waking up from sleep.  Short-term weight loss.  Unconsciousness. Follow these instructions at home:  If told by your doctor, drink an ORS:  Make an ORS by using instructions on the package.  Start by drinking small amounts, about  cup (120 mL) every 5-10 minutes.  Slowly drink more until you have had the amount that your doctor said to have.  Drink enough clear fluid to keep your pee clear or pale yellow. If you  were told to drink an ORS, finish the ORS first, then start slowly drinking clear fluids. Drink fluids such as:  Water. Do not drink only water by itself. Doing that can make the salt (sodium) level in your body get too low (hyponatremia).  Ice chips.  Fruit juice that you have added water to (diluted).  Low-calorie sports drinks.  Avoid:  Alcohol.  Drinks that have a lot of sugar. These include high-calorie sports drinks, fruit juice that does not have water added, and soda.  Caffeine.  Foods that are greasy or have a lot of fat or sugar.  Take over-the-counter and prescription medicines only as told by your doctor.  Do not take salt tablets. Doing that can make the salt level in your body get too high (hypernatremia).  Eat foods that have minerals (electrolytes). Examples include bananas, oranges, potatoes, tomatoes, and spinach.  Keep all follow-up visits as told by your doctor. This is important. Contact a doctor if:  You have belly (abdominal) pain that:  Gets worse.  Stays in one area (localizes).  You have a rash.  You have a stiff neck.  You get angry or annoyed more easily than normal (irritability).  You are more sleepy than normal.  You have a harder time waking up than normal.  You feel:  Weak.  Dizzy.  Very thirsty.  You have peed (urinated) only a small amount of very dark pee during  6-8 hours. Get help right away if:  You have symptoms of very bad dehydration.  You cannot drink fluids without throwing up (vomiting).  Your symptoms get worse with treatment.  You have a fever.  You have a very bad headache.  You are throwing up or having watery poop (diarrhea) and it:  Gets worse.  Does not go away.  You have blood or something green (bile) in your throw-up.  You have blood in your poop (stool). This may cause poop to look black and tarry.  You have not peed in 6-8 hours.  You pass out (faint).  Your heart rate when you are  sitting still is more than 100 beats a minute.  You have trouble breathing. This information is not intended to replace advice given to you by your health care provider. Make sure you discuss any questions you have with your health care provider. Document Released: 09/10/2009 Document Revised: 06/03/2016 Document Reviewed: 01/08/2016 Elsevier Interactive Patient Education  2017 [employer].

## 2017-01-27 NOTE — Assessment & Plan Note (Signed)
Reports worsening water intake and skipping meals. Notes light headed most days x 2 weeks. With headaches and intermittent blurry vision which improves with concentration. Has also noted increased stress at work lately. Encouraged to try to take get 64 oz of clear fluids daily. Avoid skipping meals

## 2017-01-27 NOTE — Progress Notes (Signed)
Subjective:  I acted as a Education administrator for Dr. Charlett Blake. [place], [place]  Patient ID: Tara Greene, female    DOB: [birth year], 46 y.o.   MRN: [id number]  Chief Complaint  Patient presents with  . Follow-up  . Hypertension  . Hyperlipidemia    Hypertension  Associated symptoms include blurred vision, headaches and neck pain. Pertinent negatives include no chest pain, malaise/fatigue, palpitations or shortness of breath.  Hyperlipidemia  Pertinent negatives include no chest pain or shortness of breath.    Patient is in today for 6 monht follow up for hypertension, hyperlipidemia and other medical concerns. Patient complains of being light headed. She acknowledges not drinking 64 oz of clear fluids or eating regular meals. At works she goes long periods of times without eating a protein. No other neurologic concerns except she has noted some mild headaches with occasional blurry vision lately. Denies CP/palp/SOB/congestion/fevers/GI or GU c/o. Taking meds as prescribed *Patient Care Team: Mosie Lukes, MD as PCP - General (Family Medicine) [place] Bruins, MD as Attending Physician (Obstetrics and Gynecology)   Past Medical History:  Diagnosis Date  . Abnormal TSH 06/14/2015  . Allergy   . Anemia   . Dehydration 01/27/2017  . Dermatitis 10/18/2013  . GERD (gastroesophageal reflux disease)   . Hyperlipidemia   . Other and unspecified hyperlipidemia 10/18/2013  . Perimenopause 04/12/2014    No past surgical history on file.  Family History  Problem Relation Age of Onset  . Hypertension Mother   . Hyperlipidemia Mother   . Cancer Mother 73    colon   . COPD Father     Social History   Social History  . Marital status: Married    Spouse name: N/A  . Number of children: 3  . 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 Smoking status: Never Smoker  . Smokeless tobacco: Never Used  . Alcohol use No  . Drug use: No  .  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 Not on file   Social History Narrative  . No narrative on file    Outpatient Medications Prior to Visit  Medication Sig Dispense Refill  . fexofenadine (ALLEGRA) 180 MG tablet Take 1 tablet (180 mg total) by mouth daily.    . fluticasone (FLONASE) 50 MCG/ACT nasal spray Place 1 spray into both nostrils daily.  2  . Multiple Vitamin (MULTIVITAMIN) tablet Take 1 tablet by mouth daily.      Marland Kitchen triamcinolone cream (KENALOG) 0.1 % Apply 1 application topically 2 (two) times daily. As needed for rash 85.2 g 2  . amLODipine (NORVASC) 10 MG tablet TAKE 1 TABLET (10 MG TOTAL) BY MOUTH DAILY. 90 tablet 1  . omeprazole (PRILOSEC) 40 MG capsule Take 1 capsule (40 mg total) by mouth daily. 30 capsule 3   No facility-administered medications prior to visit.     No Known Allergies  Review of Systems  Constitutional: Negative for fever and malaise/fatigue.  HENT: Negative for congestion.   Eyes: Positive for blurred vision.  Respiratory: Negative for cough and shortness of breath.   Cardiovascular: Negative for chest pain, palpitations and leg swelling.  Gastrointestinal: Positive for nausea. Negative for vomiting.  Musculoskeletal: Positive for neck pain. Negative for back pain.  Skin: Negative for rash.  Neurological: Positive for dizziness  and headaches. Negative for loss of consciousness.       Objective:    Physical Exam  Constitutional: She is oriented to person, place, and time. She appears well-developed and well-nourished. No distress.  HENT:  Head: Normocephalic and atraumatic.  Dry mucous membranes  Eyes: Conjunctivae are normal.  Neck: Normal range of motion. No thyromegaly present.  Cardiovascular: Normal rate and regular rhythm.   Pulmonary/Chest: Effort normal and breath sounds normal. She has no wheezes.  Abdominal: Soft. Bowel sounds are  normal. There is no tenderness.  Musculoskeletal: Normal range of motion. She exhibits no edema or deformity.  Neurological: She is alert and oriented to person, place, and time. A cranial nerve deficit is present.  Skin: Skin is warm and dry. She is not diaphoretic.  Psychiatric: She has a normal mood and affect.    BP 124/80 (BP Location: Left Arm, Patient Position: Sitting, Cuff Size: Normal)   Pulse 80   Temp 99.1 F (37.3 C) (Oral)   Resp 18   Wt 150 lb (68 kg)   LMP 01/08/2016 Comment: Irregular per patient  SpO2 98%   BMI 28.34 kg/m  Wt Readings from Last 3 Encounters:  01/27/17 150 lb (68 kg)  09/27/16 154 lb 3.2 oz (69.9 kg)  07/25/16 151 lb 3.2 oz (68.6 kg)     Lab Results  Component Value Date   WBC 6.6 07/07/2016   HGB 13.2 07/07/2016   HCT 39.2 07/07/2016   PLT 341.0 07/07/2016   GLUCOSE 86 08/23/2016   CHOL 210 (H) 07/07/2016   TRIG 178.0 (H) 07/07/2016   HDL 46.50 07/07/2016   LDLCALC 128 (H) 07/07/2016   ALT 28 07/07/2016   AST 22 07/07/2016   NA 142 08/23/2016   K 3.5 08/23/2016   CL 103 08/23/2016   CREATININE 0.75 08/23/2016   BUN 11 08/23/2016   CO2 31 08/23/2016   TSH 2.21 07/07/2016    Lab Results  Component Value Date   TSH 2.21 07/07/2016   Lab Results  Component Value Date   WBC 6.6 07/07/2016   HGB 13.2 07/07/2016   HCT 39.2 07/07/2016   MCV 82.2 07/07/2016   PLT 341.0 07/07/2016   Lab Results  Component Value Date   NA 142 08/23/2016   K 3.5 08/23/2016   CO2 31 08/23/2016   GLUCOSE 86 08/23/2016   BUN 11 08/23/2016   CREATININE 0.75 08/23/2016   BILITOT 0.6 07/07/2016   ALKPHOS 75 07/07/2016   AST 22 07/07/2016   ALT 28 07/07/2016   PROT 7.7 07/07/2016   ALBUMIN 4.2 07/07/2016   CALCIUM 9.3 08/23/2016   GFR 87.80 08/23/2016   Lab Results  Component Value Date   CHOL 210 (H) 07/07/2016   Lab Results  Component Value Date   HDL 46.50 07/07/2016   Lab Results  Component Value Date   LDLCALC 128 (H) 07/07/2016    Lab Results  Component Value Date   TRIG 178.0 (H) 07/07/2016   Lab Results  Component Value Date   CHOLHDL 5 07/07/2016   No results found for: HGBA1C     Assessment & Plan:   Problem List Items Addressed This Visit    Anemia    Check cbc today      Relevant Orders   VITAMIN D 25 Hydroxy (Vit-D Deficiency, Fractures)   Hyperlipidemia    Encouraged heart healthy diet, increase exercise, avoid trans fats, consider a krill oil cap daily      Relevant Medications  amLODipine (NORVASC) 10 MG tablet   Other Relevant Orders   Lipid panel   Abnormal TSH    Recheck TSH today      Relevant Orders   TSH   HTN (hypertension)    Well controlled, no changes to meds. Encouraged heart healthy diet such as the DASH diet and exercise as tolerated.       Relevant Medications   amLODipine (NORVASC) 10 MG tablet   Other Relevant Orders   CBC   Comprehensive metabolic panel   TSH   Dehydration    Reports worsening water intake and skipping meals. Notes light headed most days x 2 weeks. With headaches and intermittent blurry vision which improves with concentration. Has also noted increased stress at work lately. Encouraged to try to take get 64 oz of clear fluids daily. Avoid skipping meals          I am having Ms. Eischen maintain her multivitamin, fluticasone, fexofenadine, triamcinolone cream, amLODipine, and omeprazole.  Meds ordered this encounter  Medications  . amLODipine (NORVASC) 10 MG tablet    Sig: Take 1 tablet (10 mg total) by mouth daily.    Dispense:  90 tablet    Refill:  1  . omeprazole (PRILOSEC) 40 MG capsule    Sig: Take 1 capsule (40 mg total) by mouth daily.    Dispense:  30 capsule    Refill:  3    CMA served as scribe during this visit. History, Physical and Plan performed by medical provider. Documentation and orders reviewed and attested to.  Penni Homans, MD

## 2017-02-02 MED ORDER — VITAMIN D (ERGOCALCIFEROL) 1.25 MG (50000 UNIT) PO CAPS: 50000.0000 [IU] | ORAL_CAPSULE | ORAL | 4 refills | Status: DC

## 2017-02-02 MED FILL — VIT D2 1.25 MG (50,000 UNIT: 1.25 MG | 28 days supply | Qty: 4 | Fill #0

## 2017-02-02 NOTE — Addendum Note (Signed)
Addended by: Magdalene Molly A on: 02/02/2017 10:06 AM   Modules accepted: Orders

## 2017-03-27 MED FILL — VIT D2 1.25 MG (50,000 UNIT: 1.25 MG | 28 days supply | Qty: 4 | Fill #1

## 2017-05-08 MED FILL — VIT D2 1.25 MG (50,000 UNIT: 1.25 MG | 28 days supply | Qty: 4 | Fill #2

## 2017-05-08 MED FILL — AMLODIPINE BESYLATE 10 MG T: 10 | 90 days supply | Qty: 90 | Fill #1

## 2017-08-07 ENCOUNTER — Other Ambulatory Visit: Payer: Self-pay | Admitting: Family Medicine

## 2017-08-07 ENCOUNTER — Telehealth: Payer: Self-pay | Admitting: Family Medicine

## 2017-08-07 DIAGNOSIS — Z1231 Encounter for screening mammogram for malignant neoplasm of breast: Secondary | ICD-10-CM

## 2017-08-07 MED ORDER — AMLODIPINE BESYLATE 10 MG PO TABS: 10.0000 mg | ORAL_TABLET | Freq: Every day | ORAL | 0 refills | Status: DC

## 2017-08-07 MED FILL — VIT D2 1.25 MG (50,000 UNIT: 1.25 MG | 28 days supply | Qty: 4 | Fill #3

## 2017-08-07 NOTE — Telephone Encounter (Signed)
Pt req refill Amlodipine to [facility] downstairs. Call pt if needed [phone].

## 2017-08-07 NOTE — Telephone Encounter (Signed)
Medication sent to pharmacy    PC 

## 2017-08-09 ENCOUNTER — Other Ambulatory Visit: Payer: Self-pay | Admitting: Family Medicine

## 2017-08-09 MED FILL — AMLODIPINE BESYLATE 10 MG T: 10 | 90 days supply | Qty: 90 | Fill #0

## 2017-08-14 ENCOUNTER — Ambulatory Visit
Admission: RE | Admit: 2017-08-14 | Discharge: 2017-08-14 | Disposition: A | Discharge status: Home / Self Care | Payer: 59 | Source: Ambulatory Visit | Attending: Family Medicine | Admitting: Family Medicine

## 2017-08-14 DIAGNOSIS — Z1231 Encounter for screening mammogram for malignant neoplasm of breast: Secondary | ICD-10-CM

## 2017-08-21 ENCOUNTER — Encounter: Payer: Self-pay | Admitting: Family Medicine

## 2017-08-21 ENCOUNTER — Ambulatory Visit (HOSPITAL_BASED_OUTPATIENT_CLINIC_OR_DEPARTMENT_OTHER)
Admission: RE | Admit: 2017-08-21 | Discharge: 2017-08-21 | Disposition: A | Discharge status: Home / Self Care | Payer: 59 | Source: Ambulatory Visit | Attending: Family Medicine | Admitting: Family Medicine

## 2017-08-21 ENCOUNTER — Ambulatory Visit (INDEPENDENT_AMBULATORY_CARE_PROVIDER_SITE_OTHER): Payer: 59 | Admitting: Family Medicine

## 2017-08-21 VITALS — BP 119/80 | HR 78 | Temp 98.7°F | Resp 18 | Wt 145.8 lb

## 2017-08-21 DIAGNOSIS — E559 Vitamin D deficiency, unspecified: Secondary | ICD-10-CM | POA: Diagnosis not present

## 2017-08-21 DIAGNOSIS — E782 Mixed hyperlipidemia: Secondary | ICD-10-CM

## 2017-08-21 DIAGNOSIS — M542 Cervicalgia: Secondary | ICD-10-CM

## 2017-08-21 DIAGNOSIS — M50323 Other cervical disc degeneration at C6-C7 level: Secondary | ICD-10-CM | POA: Insufficient documentation

## 2017-08-21 DIAGNOSIS — I10 Essential (primary) hypertension: Secondary | ICD-10-CM

## 2017-08-21 DIAGNOSIS — K219 Gastro-esophageal reflux disease without esophagitis: Secondary | ICD-10-CM | POA: Diagnosis not present

## 2017-08-21 DIAGNOSIS — Z0000 Encounter for general adult medical examination without abnormal findings: Secondary | ICD-10-CM

## 2017-08-21 DIAGNOSIS — M47812 Spondylosis without myelopathy or radiculopathy, cervical region: Secondary | ICD-10-CM | POA: Diagnosis not present

## 2017-08-21 DIAGNOSIS — E785 Hyperlipidemia, unspecified: Secondary | ICD-10-CM

## 2017-08-21 LAB — COMPREHENSIVE METABOLIC PANEL
ALT: 15 U/L (ref 0–35)
AST: 14 U/L (ref 0–37)
Albumin: 4.3 g/dL (ref 3.5–5.2)
Alkaline Phosphatase: 90 U/L (ref 39–117)
BUN: 14 mg/dL (ref 6–23)
CHLORIDE: 103 meq/L (ref 96–112)
CO2: 29 meq/L (ref 19–32)
Calcium: 9.5 mg/dL (ref 8.4–10.5)
Creatinine, Ser: 0.71 mg/dL (ref 0.40–1.20)
GFR: 93.14 mL/min (ref >60.00)
GLUCOSE: 101 mg/dL — AB (ref 70–99)
Potassium: 3.5 mEq/L (ref 3.5–5.1)
SODIUM: 141 meq/L (ref 135–145)
Total Bilirubin: 0.5 mg/dL (ref 0.2–1.2)
Total Protein: 7.6 g/dL (ref 6.0–8.3)

## 2017-08-21 LAB — CBC
HEMATOCRIT: 41.7 % (ref 36.0–46.0)
Hemoglobin: 13.8 g/dL (ref 12.0–15.0)
MCHC: 33.1 g/dL (ref 30.0–36.0)
MCV: 84.3 fl (ref 78.0–100.0)
Platelets: 347 10*3/uL (ref 150.0–400.0)
RBC: 4.95 Mil/uL (ref 3.87–5.11)
RDW: 13.7 % (ref 11.5–15.5)
WBC: 4.7 10*3/uL (ref 4.0–10.5)

## 2017-08-21 LAB — TSH: TSH: 1.64 u[IU]/mL (ref 0.35–4.50)

## 2017-08-21 LAB — LIPID PANEL
Cholesterol: 212 mg/dL — ABNORMAL HIGH (ref 0–200)
HDL: 49.3 mg/dL (ref >39.00)
LDL CALC: 137 mg/dL — AB (ref 0–99)
NONHDL: 163.18
Total CHOL/HDL Ratio: 4
Triglycerides: 132 mg/dL (ref 0.0–149.0)
VLDL: 26.4 mg/dL (ref 0.0–40.0)

## 2017-08-21 LAB — VITAMIN D 25 HYDROXY (VIT D DEFICIENCY, FRACTURES): VITD: 25.51 ng/mL — ABNORMAL LOW (ref 30.00–100.00)

## 2017-08-21 NOTE — Assessment & Plan Note (Signed)
Avoid offending foods, start probiotics. Do not eat large meals in late evening and consider raising head of bed. Uses Omeprazole prn.

## 2017-08-21 NOTE — Assessment & Plan Note (Addendum)
Is a daily complaint. Proceed with baselin xray due to daily symptoms. Encouraged moist heat and gentle stretching as tolerated. May try NSAIDs and prescription meds as directed and report if symptoms worsen or seek immediate care.

## 2017-08-21 NOTE — Patient Instructions (Addendum)
moist heat and gentle stretching as tolerated. May try NSAIDs and prescription meds as directed and report if symptoms worsen or seek immediate care  DASH or MIND diet Preventive Care 40-64 Years, Female Preventive care refers to lifestyle choices and visits with your health care provider that can promote health and wellness. What does preventive care include?  A yearly physical exam. This is also called an annual well check.  Dental exams once or twice a year.  Routine eye exams. Ask your health care provider how often you should have your eyes checked.  Personal lifestyle choices, including: ? Daily care of your teeth and gums. ? Regular physical activity. ? Eating a healthy diet. ? Avoiding tobacco and drug use. ? Limiting alcohol use. ? Practicing safe sex. ? Taking low-dose aspirin daily starting at age 53. ? Taking vitamin and mineral supplements as recommended by your health care provider. What happens during an annual well check? The services and screenings done by your health care provider during your annual well check will depend on your age, overall health, lifestyle risk factors, and family history of disease. Counseling Your health care provider may ask you questions about your:  Alcohol use.  Tobacco use.  Drug use.  Emotional well-being.  Home and relationship well-being.  Sexual activity.  Eating habits.  Work and work Statistician.  Method of birth control.  Menstrual cycle.  Pregnancy history.  Screening You may have the following tests or measurements:  Height, weight, and BMI.  Blood pressure.  Lipid and cholesterol levels. These may be checked every 5 years, or more frequently if you are over 32 years old.  Skin check.  Lung cancer screening. You may have this screening every year starting at age 48 if you have a 30-pack-year history of smoking and currently smoke or have quit within the past 15 years.  Fecal occult blood test  (FOBT) of the stool. You may have this test every year starting at age 88.  Flexible sigmoidoscopy or colonoscopy. You may have a sigmoidoscopy every 5 years or a colonoscopy every 10 years starting at age 15.  Hepatitis C blood test.  Hepatitis B blood test.  Sexually transmitted disease (STD) testing.  Diabetes screening. This is done by checking your blood sugar (glucose) after you have not eaten for a while (fasting). You may have this done every 1-3 years.  Mammogram. This may be done every 1-2 years. Talk to your health care provider about when you should start having regular mammograms. This may depend on whether you have a family history of breast cancer.  BRCA-related cancer screening. This may be done if you have a family history of breast, ovarian, tubal, or peritoneal cancers.  Pelvic exam and Pap test. This may be done every 3 years starting at age 38. Starting at age 46, this may be done every 5 years if you have a Pap test in combination with an HPV test.  Bone density scan. This is done to screen for osteoporosis. You may have this scan if you are at high risk for osteoporosis.  Discuss your test results, treatment options, and if necessary, the need for more tests with your health care provider. Vaccines Your health care provider may recommend certain vaccines, such as:  Influenza vaccine. This is recommended every year.  Tetanus, diphtheria, and acellular pertussis (Tdap, Td) vaccine. You may need a Td booster every 10 years.  Varicella vaccine. You may need this if you have not been vaccinated.  Zoster vaccine. You may need this after age 7.  Measles, mumps, and rubella (MMR) vaccine. You may need at least one dose of MMR if you were born in 1957 or later. You may also need a second dose.  Pneumococcal 13-valent conjugate (PCV13) vaccine. You may need this if you have certain conditions and were not previously vaccinated.  Pneumococcal polysaccharide (PPSV23)  vaccine. You may need one or two doses if you smoke cigarettes or if you have certain conditions.  Meningococcal vaccine. You may need this if you have certain conditions.  Hepatitis A vaccine. You may need this if you have certain conditions or if you travel or work in places where you may be exposed to hepatitis A.  Hepatitis B vaccine. You may need this if you have certain conditions or if you travel or work in places where you may be exposed to hepatitis B.  Haemophilus influenzae type b (Hib) vaccine. You may need this if you have certain conditions.  Talk to your health care provider about which screenings and vaccines you need and how often you need them. This information is not intended to replace advice given to you by your health care provider. Make sure you discuss any questions you have with your health care provider. Document Released: 12/11/2015 Document Revised: 08/03/2016 Document Reviewed: 09/15/2015 Elsevier Interactive Patient Education  2017 [employer] and gentle stretching as tolerated. May try NSAIDs and prescription meds as directed and report if symptoms worsen or seek immediate care

## 2017-08-21 NOTE — Assessment & Plan Note (Signed)
Well controlled, no changes to meds. Encouraged heart healthy diet such as the DASH diet and exercise as tolerated.  °

## 2017-08-21 NOTE — Assessment & Plan Note (Signed)
Patient encouraged to maintain heart healthy diet, regular exercise, adequate sleep. Consider daily probiotics. Take medications as prescribed. Labs ordered today

## 2017-08-21 NOTE — Assessment & Plan Note (Signed)
Declines pap today agrees to return in 6 months for pap

## 2017-08-21 NOTE — Assessment & Plan Note (Signed)
Encouraged heart healthy diet, increase exercise, avoid trans fats, consider a krill oil cap daily 

## 2017-08-21 NOTE — Progress Notes (Signed)
Subjective:  I acted as a Education administrator for Dr. Charlett Blake. [place], [place]  Patient ID: Tara Greene, female    DOB: 1971-08-17, 46 y.o.   MRN: [id number]  No chief complaint on file.   HPI  Patient is in today for an annual exam and is doing well. She notes some chronic pain most notably in her neck, no falls or injury. She denies any recent febrile illness or hospitalizations. Denies CP/palp/SOB/HA/congestion/fevers/GI or GU c/o. Taking meds as prescribed. Tries to maintain a heart healthy diet and staying active.   Patient Care Team: Mosie Lukes, MD as PCP - General (Family Medicine) [place] Bruins, MD as Attending Physician (Obstetrics and Gynecology)   Past Medical History:  Diagnosis Date  . Abnormal TSH 06/14/2015  . Allergy   . Anemia   . Dehydration 01/27/2017  . Dermatitis 10/18/2013  . GERD (gastroesophageal reflux disease)   . Hyperlipidemia   . Other and unspecified hyperlipidemia 10/18/2013  . Perimenopause 04/12/2014    History reviewed. No pertinent surgical history.  Family History  Problem Relation Age of Onset  . Hypertension Mother   . Hyperlipidemia Mother   . Cancer Mother 56       colon   . COPD Father     Social History   Social History  . Marital status: Married    Spouse name: N/A  . Number of children: 3  . 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 Smoking status: Never Smoker  . Smokeless tobacco: Never Used  . Alcohol use No  . Drug use: No  . 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 Not on file   Social History Narrative  . No narrative on file    Outpatient Medications Prior to Visit  Medication Sig Dispense Refill  . amLODipine (NORVASC) 10 MG tablet Take 1 tablet (10 mg total) by mouth daily. 90 tablet 0  . fexofenadine (ALLEGRA) 180 MG tablet Take 1 tablet (180 mg total) by mouth  daily.    . fluticasone (FLONASE) 50 MCG/ACT nasal spray Place 1 spray into both nostrils daily.  2  . Multiple Vitamin (MULTIVITAMIN) tablet Take 1 tablet by mouth daily.      Marland Kitchen omeprazole (PRILOSEC) 40 MG capsule Take 1 capsule (40 mg total) by mouth daily. 30 capsule 3  . triamcinolone cream (KENALOG) 0.1 % Apply 1 application topically 2 (two) times daily. As needed for rash 85.2 g 2  . Vitamin D, Ergocalciferol, (DRISDOL) 50000 units CAPS capsule Take 1 capsule (50,000 Units total) by mouth every 7 (seven) days. 4 capsule 4   No facility-administered medications prior to visit.     No Known Allergies  Review of Systems  Constitutional: Negative for fever and malaise/fatigue.  HENT: Negative for congestion.   Eyes: Negative for blurred vision.  Respiratory: Negative for cough and shortness of breath.   Cardiovascular: Negative for chest pain, palpitations and leg swelling.  Gastrointestinal: Positive for heartburn. Negative for vomiting.  Musculoskeletal: Positive for neck pain. Negative for back pain.  Skin: Negative for rash.  Neurological: Negative for loss of consciousness and headaches.       Objective:    Physical Exam  Constitutional: She is oriented to person, place, and time. She appears well-developed and well-nourished. No distress.  HENT:  Head:  Normocephalic and atraumatic.  Eyes: Conjunctivae are normal.  Neck: Normal range of motion. No thyromegaly present.  Cardiovascular: Normal rate and regular rhythm.   Pulmonary/Chest: Effort normal and breath sounds normal. She has no wheezes.  Abdominal: Soft. Bowel sounds are normal. There is no tenderness.  Musculoskeletal: Normal range of motion. She exhibits no edema or deformity.  Neurological: She is alert and oriented to person, place, and time.  Skin: Skin is warm and dry. She is not diaphoretic.  Psychiatric: She has a normal mood and affect.    BP 119/80 (BP Location: Left Arm, Patient Position: Sitting,  Cuff Size: Normal)   Pulse 78   Temp 98.7 F (37.1 C) (Oral)   Resp 18   Wt 145 lb 12.8 oz (66.1 kg)   LMP 01/08/2016 Comment: Irregular per patient  SpO2 99%   BMI 27.55 kg/m  Wt Readings from Last 3 Encounters:  08/21/17 145 lb 12.8 oz (66.1 kg)  01/27/17 150 lb (68 kg)  09/27/16 154 lb 3.2 oz (69.9 kg)   BP Readings from Last 3 Encounters:  08/21/17 119/80  01/27/17 124/80  09/27/16 118/75     Immunization History  Administered Date(s) Administered  . Influenza Split 09/02/2011  . Influenza Whole 09/11/2013  . Influenza-Unspecified 08/28/2016  . Tdap 06/27/2013    Health Maintenance  Topic Date Due  . PAP SMEAR  04/07/2017  . INFLUENZA VACCINE  06/28/2017  . HIV Screening  09/27/2017 (Originally 01/09/1984)  . TETANUS/TDAP  06/28/2023    Lab Results  Component Value Date   WBC 4.7 08/21/2017   HGB 13.8 08/21/2017   HCT 41.7 08/21/2017   PLT 347.0 08/21/2017   GLUCOSE 101 (H) 08/21/2017   CHOL 212 (H) 08/21/2017   TRIG 132.0 08/21/2017   HDL 49.30 08/21/2017   LDLCALC 137 (H) 08/21/2017   ALT 15 08/21/2017   AST 14 08/21/2017   NA 141 08/21/2017   K 3.5 08/21/2017   CL 103 08/21/2017   CREATININE 0.71 08/21/2017   BUN 14 08/21/2017   CO2 29 08/21/2017   TSH 1.64 08/21/2017    Lab Results  Component Value Date   TSH 1.64 08/21/2017   Lab Results  Component Value Date   WBC 4.7 08/21/2017   HGB 13.8 08/21/2017   HCT 41.7 08/21/2017   MCV 84.3 08/21/2017   PLT 347.0 08/21/2017   Lab Results  Component Value Date   NA 141 08/21/2017   K 3.5 08/21/2017   CO2 29 08/21/2017   GLUCOSE 101 (H) 08/21/2017   BUN 14 08/21/2017   CREATININE 0.71 08/21/2017   BILITOT 0.5 08/21/2017   ALKPHOS 90 08/21/2017   AST 14 08/21/2017   ALT 15 08/21/2017   PROT 7.6 08/21/2017   ALBUMIN 4.3 08/21/2017   CALCIUM 9.5 08/21/2017   GFR 93.14 08/21/2017   Lab Results  Component Value Date   CHOL 212 (H) 08/21/2017   Lab Results  Component Value Date    HDL 49.30 08/21/2017   Lab Results  Component Value Date   LDLCALC 137 (H) 08/21/2017   Lab Results  Component Value Date   TRIG 132.0 08/21/2017   Lab Results  Component Value Date   CHOLHDL 4 08/21/2017   No results found for: HGBA1C       Assessment & Plan:   Problem List Items Addressed This Visit    Preventative health care    Patient encouraged to maintain heart healthy diet, regular exercise, adequate sleep. Consider daily probiotics.  Take medications as prescribed. Labs ordered today      Hyperlipidemia    Encouraged heart healthy diet, increase exercise, avoid trans fats, consider a krill oil cap daily      Relevant Orders   Lipid panel (Completed)   HTN (hypertension)    Well controlled, no changes to meds. Encouraged heart healthy diet such as the DASH diet and exercise as tolerated.       Relevant Orders   CBC (Completed)   Comprehensive metabolic panel (Completed)   TSH (Completed)   GERD (gastroesophageal reflux disease)    Avoid offending foods, start probiotics. Do not eat large meals in late evening and consider raising head of bed. Uses Omeprazole prn.      Neck pain    Is a daily complaint. Proceed with baselin xray due to daily symptoms. Encouraged moist heat and gentle stretching as tolerated. May try NSAIDs and prescription meds as directed and report if symptoms worsen or seek immediate care.       Relevant Orders   DG Cervical Spine Complete (Completed)    Other Visit Diagnoses    Vitamin D deficiency    -  Primary   Relevant Orders   VITAMIN D 25 Hydroxy (Vit-D Deficiency, Fractures) (Completed)      I am having Ms. Heathcock maintain her multivitamin, fluticasone, fexofenadine, triamcinolone cream, omeprazole, amLODipine, and Vitamin D (Ergocalciferol).  Meds ordered this encounter  Medications  . Vitamin D, Ergocalciferol, (DRISDOL) 50000 units CAPS capsule    Sig: Take 1 capsule (50,000 Units total) by mouth every 7 (seven) days.     Dispense:  4 capsule    Refill:  4    CMA served as scribe during this visit. History, Physical and Plan performed by medical provider. Documentation and orders reviewed and attested to.  Penni Homans, MD

## 2017-08-22 MED ORDER — VITAMIN D (ERGOCALCIFEROL) 1.25 MG (50000 UNIT) PO CAPS: 50000.0000 [IU] | ORAL_CAPSULE | ORAL | 4 refills | Status: DC

## 2017-08-22 MED FILL — OMEPRAZOLE DR 40 MG CAPSULE: 40 | 30 days supply | Qty: 30 | Fill #1

## 2017-08-23 ENCOUNTER — Telehealth: Payer: Self-pay | Admitting: Family Medicine

## 2017-08-23 NOTE — Telephone Encounter (Signed)
Pt returned call to [facility] for lab results. Pt states can call tomorrow with results since Charlett Blake out of the office today. Pt ph [phone].

## 2017-08-25 NOTE — Telephone Encounter (Signed)
Called left message for patient to call the office back.    PC

## 2017-08-25 NOTE — Telephone Encounter (Signed)
Called and left message for Pt to call back. Calling to inform Pt of recent imaging results.

## 2017-08-25 NOTE — Telephone Encounter (Signed)
-----   Message from Mosie Lukes, MD sent at 08/21/2017  9:34 PM EDT ----- Notify mild degenerative changes.

## 2017-09-11 MED FILL — VIT D2 1.25 MG (50,000 UNIT: 1.25 MG | 28 days supply | Qty: 4 | Fill #4

## 2017-11-03 ENCOUNTER — Other Ambulatory Visit: Payer: Self-pay

## 2017-11-03 MED ORDER — AMLODIPINE BESYLATE 10 MG PO TABS: 10.0000 mg | ORAL_TABLET | Freq: Every day | ORAL | 1 refills | Status: DC

## 2017-11-03 MED FILL — AMLODIPINE BESYLATE 10 MG T: 10 | 90 days supply | Qty: 90 | Fill #0

## 2017-11-07 MED FILL — OMEPRAZOLE DR 40 MG CAPSULE: 40 | 30 days supply | Qty: 30 | Fill #2

## 2018-01-02 MED FILL — OMEPRAZOLE DR 40 MG CAPSULE: 40 | 30 days supply | Qty: 30 | Fill #3

## 2018-02-07 MED FILL — AMLODIPINE BESYLATE 10 MG T: 10 | 90 days supply | Qty: 90 | Fill #1

## 2018-03-13 DIAGNOSIS — H524 Presbyopia: Secondary | ICD-10-CM | POA: Diagnosis not present

## 2018-04-27 ENCOUNTER — Encounter: Payer: Self-pay | Admitting: Family

## 2018-04-27 ENCOUNTER — Ambulatory Visit (INDEPENDENT_AMBULATORY_CARE_PROVIDER_SITE_OTHER): Payer: 59 | Admitting: Family

## 2018-04-27 ENCOUNTER — Other Ambulatory Visit (HOSPITAL_COMMUNITY)
Admission: RE | Admit: 2018-04-27 | Discharge: 2018-04-27 | Disposition: A | Discharge status: Home / Self Care | Payer: 59 | Source: Ambulatory Visit | Attending: Family | Admitting: Family

## 2018-04-27 VITALS — BP 123/75 | HR 71 | Temp 98.7°F | Resp 16 | Ht 61.0 in | Wt 140.0 lb

## 2018-04-27 DIAGNOSIS — Z01419 Encounter for gynecological examination (general) (routine) without abnormal findings: Secondary | ICD-10-CM | POA: Insufficient documentation

## 2018-04-27 DIAGNOSIS — I10 Essential (primary) hypertension: Secondary | ICD-10-CM

## 2018-04-27 DIAGNOSIS — E559 Vitamin D deficiency, unspecified: Secondary | ICD-10-CM

## 2018-04-27 DIAGNOSIS — K219 Gastro-esophageal reflux disease without esophagitis: Secondary | ICD-10-CM

## 2018-04-27 DIAGNOSIS — Z0000 Encounter for general adult medical examination without abnormal findings: Secondary | ICD-10-CM | POA: Diagnosis not present

## 2018-04-27 LAB — LIPID PANEL
CHOL/HDL RATIO: 4
Cholesterol: 208 mg/dL — ABNORMAL HIGH (ref 0–200)
HDL: 52.7 mg/dL (ref >39.00)
LDL Cholesterol: 139 mg/dL — ABNORMAL HIGH (ref 0–99)
NONHDL: 155.26
Triglycerides: 79 mg/dL (ref 0.0–149.0)
VLDL: 15.8 mg/dL (ref 0.0–40.0)

## 2018-04-27 LAB — BASIC METABOLIC PANEL
BUN: 11 mg/dL (ref 6–23)
CALCIUM: 9.8 mg/dL (ref 8.4–10.5)
CHLORIDE: 101 meq/L (ref 96–112)
CO2: 30 mEq/L (ref 19–32)
CREATININE: 0.7 mg/dL (ref 0.40–1.20)
GFR: 94.41 mL/min (ref >60.00)
Glucose, Bld: 94 mg/dL (ref 70–99)
Potassium: 4.1 mEq/L (ref 3.5–5.1)
Sodium: 140 mEq/L (ref 135–145)

## 2018-04-27 LAB — URINALYSIS, ROUTINE W REFLEX MICROSCOPIC
Bilirubin Urine: NEGATIVE
Hgb urine dipstick: NEGATIVE
KETONES UR: NEGATIVE
Leukocytes, UA: NEGATIVE
NITRITE: NEGATIVE
RBC / HPF: NONE SEEN (ref 0–?)
Total Protein, Urine: NEGATIVE
Urine Glucose: NEGATIVE
Urobilinogen, UA: 0.2 (ref 0.0–1.0)
pH: 6 (ref 5.0–8.0)

## 2018-04-27 LAB — CBC WITH DIFFERENTIAL/PLATELET
BASOS ABS: 0.1 10*3/uL (ref 0.0–0.1)
BASOS PCT: 0.9 % (ref 0.0–3.0)
EOS ABS: 0.1 10*3/uL (ref 0.0–0.7)
Eosinophils Relative: 2.2 % (ref 0.0–5.0)
HEMATOCRIT: 38.8 % (ref 36.0–46.0)
Hemoglobin: 13 g/dL (ref 12.0–15.0)
Lymphocytes Relative: 26.6 % (ref 12.0–46.0)
Lymphs Abs: 1.8 10*3/uL (ref 0.7–4.0)
MCHC: 33.6 g/dL (ref 30.0–36.0)
MCV: 84.8 fl (ref 78.0–100.0)
Monocytes Absolute: 0.3 10*3/uL (ref 0.1–1.0)
Monocytes Relative: 5.1 % (ref 3.0–12.0)
Neutro Abs: 4.4 10*3/uL (ref 1.4–7.7)
Neutrophils Relative %: 65.2 % (ref 43.0–77.0)
Platelets: 346 10*3/uL (ref 150.0–400.0)
RBC: 4.57 Mil/uL (ref 3.87–5.11)
RDW: 13.3 % (ref 11.5–15.5)
WBC: 6.8 10*3/uL (ref 4.0–10.5)

## 2018-04-27 LAB — HEPATIC FUNCTION PANEL
ALK PHOS: 88 U/L (ref 39–117)
ALT: 14 U/L (ref 0–35)
AST: 14 U/L (ref 0–37)
Albumin: 4.5 g/dL (ref 3.5–5.2)
BILIRUBIN DIRECT: 0.1 mg/dL (ref 0.0–0.3)
TOTAL PROTEIN: 7.7 g/dL (ref 6.0–8.3)
Total Bilirubin: 0.6 mg/dL (ref 0.2–1.2)

## 2018-04-27 LAB — TSH: TSH: 3.73 u[IU]/mL (ref 0.35–4.50)

## 2018-04-27 MED ORDER — VITAMIN D 1000 UNITS PO TABS: 1000.0000 [IU] | ORAL_TABLET | Freq: Every day | ORAL | Status: AC

## 2018-04-27 MED ORDER — OMEPRAZOLE 40 MG PO CPDR: 40.0000 mg | DELAYED_RELEASE_CAPSULE | Freq: Every day | ORAL | 5 refills | Status: DC | PRN

## 2018-04-27 MED FILL — OMEPRAZOLE 40 MG CPDR: 40 | 30 days supply | Qty: 30 | Fill #0

## 2018-04-27 NOTE — Patient Instructions (Signed)
Please complete lab work prior to leaving. Continue to work on healthy diet, exercise and weight loss.  

## 2018-04-27 NOTE — Progress Notes (Signed)
Subjective:    Patient ID: Tara Greene, female    DOB: August 21, 1971, 47 y.o.   MRN: [id number]  HPI  Tara Greene is a 47 yr old female who presents today for follow up.  Patient presents today for complete physical.  Immunizations: tetanus up to date Diet: healthy Exercise:  2-3 times a week, walks Colonoscopy: will need at age 61 LMP- >2 yrs ago Pap Smear: 5/15 Mammogram: 9/18 Vision: up to date Dental: up to date  HTN- maintained on amlodipine 10mg .  BP Readings from Last 3 Encounters:  04/27/18 123/75  08/21/17 119/80  01/27/17 124/80   Vit D deficiency- Tara Greene is  Currently taking 1000iu daily of vitamin D.   GERD- maintained on omeprazole prn, not using regularly.  Wt Readings from Last 3 Encounters:  04/27/18 140 lb (63.5 kg)  08/21/17 145 lb 12.8 oz (66.1 kg)  01/27/17 150 lb (68 kg)      Review of Systems  Constitutional: Negative for unexpected weight change.  HENT: Negative for hearing loss and rhinorrhea.   Eyes: Negative for visual disturbance.  Respiratory: Negative for cough and shortness of breath.   Cardiovascular: Negative for chest pain and leg swelling.  Gastrointestinal: Negative for constipation and diarrhea.  Genitourinary: Negative for dysuria and frequency.  Musculoskeletal: Negative for myalgias.       Has occasional knee pain, shoulder pain, wrist pain  Neurological:       Rare headaches  Hematological: Negative for adenopathy.  Psychiatric/Behavioral:       Denies depression/anxiety     Past Medical History:  Diagnosis Date  . Abnormal TSH 06/14/2015  . Allergy   . Anemia   . Dehydration 01/27/2017  . Dermatitis 10/18/2013  . GERD (gastroesophageal reflux disease)   . Hyperlipidemia   . Other and unspecified hyperlipidemia 10/18/2013  . Perimenopause 04/12/2014     Social History   Socioeconomic History  . Marital status: Married    Spouse name: Not on file  . Number of children: 3  . Years of education: Not on file    . Highest education level: Not on file  Occupational History  . Occupation: Therapist, sports- [employer]  Social Needs  . Financial resource strain: Not on file  . Food insecurity:    Worry: Not on file    Inability: Not on file  . Transportation needs:    Medical: Not on file    Non-medical: Not on file  Tobacco Use  . Smoking status: Never Smoker  . Smokeless tobacco: Never Used  Substance and Sexual Activity  . Alcohol use: No  . Drug use: No  . 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 Physical activity:    Days per week: Not on file    Minutes per session: Not on file  . Stress: Not on file  Relationships  . Social connections:    Talks on phone: Not on file    Gets together: Not on file    Attends religious service: Not on file    Active member of club or organization: Not on file    Attends meetings of clubs or organizations: Not on file    Relationship status: Not on file  . Intimate partner violence:    Fear of current or ex partner: Not on file    Emotionally abused: Not on file    Physically abused: Not on file  Forced sexual activity: Not on file  Other Topics Concern  . Not on file  Social History Narrative  . Not on file    No past surgical history on file.  Family History  Problem Relation Age of Onset  . Hypertension Mother   . Hyperlipidemia Mother   . Cancer Mother 21       colon   . COPD Father     No Known Allergies  Current Outpatient Medications on File Prior to Visit  Medication Sig Dispense Refill  . amLODipine (NORVASC) 10 MG tablet Take 1 tablet (10 mg total) by mouth daily. 90 tablet 1  . fexofenadine (ALLEGRA) 180 MG tablet Take 1 tablet (180 mg total) by mouth daily.    . fluticasone (FLONASE) 50 MCG/ACT nasal spray Place 1 spray into both nostrils daily.  2  . Multiple Vitamin (MULTIVITAMIN) tablet Take 1 tablet by mouth daily.      Marland Kitchen omeprazole (PRILOSEC) 40 MG  capsule Take 1 capsule (40 mg total) by mouth daily. 30 capsule 3  . triamcinolone cream (KENALOG) 0.1 % Apply 1 application topically 2 (two) times daily. As needed for rash 85.2 g 2  . Vitamin D, Ergocalciferol, (DRISDOL) 50000 units CAPS capsule Take 1 capsule (50,000 Units total) by mouth every 7 (seven) days. 4 capsule 4   No current facility-administered medications on file prior to visit.     BP 123/75 (BP Location: Left Arm, Patient Position: Sitting, Cuff Size: Small)   Pulse 71   Temp 98.7 F (37.1 C) (Oral)   Resp 16   Ht 5\' 1"  (1.549 m)   Wt 140 lb (63.5 kg)   LMP 01/08/2016 Comment: Irregular per patient  SpO2 100%   BMI 26.45 kg/m       Objective:   Physical Exam  Physical Exam  Constitutional: Tara Greene is oriented to person, place, and time. Tara Greene appears well-developed and well-nourished. No distress.  HENT:  Head: Normocephalic and atraumatic.  Right Ear: Tympanic membrane and ear canal normal.  Left Ear: Tympanic membrane and ear canal normal.  Mouth/Throat: Oropharynx is clear and moist.  Eyes: Pupils are equal, round, and reactive to light. No scleral icterus.  Neck: Normal range of motion. No thyromegaly present.  Cardiovascular: Normal rate and regular rhythm.   No murmur heard. Pulmonary/Chest: Effort normal and breath sounds normal. No respiratory distress. He has no wheezes. Tara Greene has no rales. Tara Greene exhibits no tenderness.  Abdominal: Soft. Bowel sounds are normal. Tara Greene exhibits no distension and no mass. There is no tenderness. There is no rebound and no guarding.  Musculoskeletal: Tara Greene exhibits no edema.  Lymphadenopathy:    Tara Greene has no cervical adenopathy.  Neurological: Tara Greene is alert and oriented to person, place, and time. Tara Greene has normal patellar reflexes. Tara Greene exhibits normal muscle tone. Coordination normal.  Skin: Skin is warm and dry.  Psychiatric: Tara Greene has a normal mood and affect. Her behavior is normal. Judgment and thought content normal.  Breasts:  Examined lying Right: Without masses, retractions, discharge or axillary adenopathy.  Left: Without masses, retractions, discharge or axillary adenopathy.  Inguinal/mons: Normal without inguinal adenopathy  External genitalia: Normal  BUS/Urethra/Skene's glands: Normal  Bladder: Normal  Vagina: Normal  Cervix: Normal  Uterus: normal in size, shape and contour. Midline and mobile  Adnexa/parametria:  Rt: Without masses or tenderness.  Lt: Without masses or tenderness.  Anus and perineum: Normal            Assessment & Plan:  Preventative care- Tara Greene has lost 12 pounds which I commended her on. Pap performed today. Mammogram up to date.Immunizations reviewed and up to date. Obtain routine lab work.   HTN- bp stable on current dose of amlodipine, continue same.  Vit d deficiency- check follow up vit d level.   GERD- stable with rare prn use of PPI.  Continue same.       Assessment & Plan:

## 2018-04-29 LAB — VITAMIN D 1,25 DIHYDROXY
Vitamin D 1, 25 (OH)2 Total: 50 pg/mL (ref 18–72)
Vitamin D2 1, 25 (OH)2: 10 pg/mL
Vitamin D3 1, 25 (OH)2: 40 pg/mL

## 2018-04-30 ENCOUNTER — Encounter: Payer: Self-pay | Admitting: Family

## 2018-05-01 LAB — CYTOLOGY - PAP
Diagnosis: NEGATIVE
HPV (WINDOPATH): NOT DETECTED

## 2018-05-14 ENCOUNTER — Other Ambulatory Visit: Payer: Self-pay

## 2018-05-14 MED ORDER — AMLODIPINE BESYLATE 10 MG PO TABS: 10.0000 mg | ORAL_TABLET | Freq: Every day | ORAL | 1 refills | Status: DC

## 2018-05-14 MED FILL — AMLODIPINE BESYLATE 10 MG T: 10 | 90 days supply | Qty: 90 | Fill #0

## 2018-07-04 MED FILL — OMEPRAZOLE 40 MG CPDR: 40 | 30 days supply | Qty: 30 | Fill #1

## 2018-08-23 MED FILL — AMLODIPINE BESYLATE 10 MG T: 10 | 90 days supply | Qty: 90 | Fill #1

## 2018-09-20 ENCOUNTER — Other Ambulatory Visit: Payer: Self-pay | Admitting: Family Medicine

## 2018-09-20 DIAGNOSIS — Z1231 Encounter for screening mammogram for malignant neoplasm of breast: Secondary | ICD-10-CM

## 2018-10-01 MED FILL — OMEPRAZOLE 40 MG CPDR: 40 | 30 days supply | Qty: 30 | Fill #2

## 2018-11-01 ENCOUNTER — Ambulatory Visit: Payer: 59

## 2018-11-30 ENCOUNTER — Other Ambulatory Visit: Payer: Self-pay | Admitting: Family Medicine

## 2018-11-30 MED FILL — OMEPRAZOLE 40 MG CPDR: 40 | 30 days supply | Qty: 30 | Fill #3

## 2018-12-03 MED FILL — AMLODIPINE BESYLATE 10 MG T: 10 | 90 days supply | Qty: 90 | Fill #0

## 2018-12-04 ENCOUNTER — Ambulatory Visit
Admission: RE | Admit: 2018-12-04 | Discharge: 2018-12-04 | Disposition: A | Discharge status: Home / Self Care | Payer: Self-pay | Source: Ambulatory Visit | Attending: Family Medicine | Admitting: Family Medicine

## 2018-12-04 DIAGNOSIS — Z1231 Encounter for screening mammogram for malignant neoplasm of breast: Secondary | ICD-10-CM

## 2019-02-20 MED FILL — OMEPRAZOLE 40 MG CPDR: 40 | 30 days supply | Qty: 30 | Fill #4

## 2019-03-20 MED FILL — AMLODIPINE BESYLATE 10 MG T: 10 | 90 days supply | Qty: 90 | Fill #1

## 2019-03-25 ENCOUNTER — Encounter: Payer: Self-pay | Admitting: Family Medicine

## 2019-04-11 MED FILL — OMEPRAZOLE 40 MG CPDR: 40 | 30 days supply | Qty: 30 | Fill #5

## 2019-06-25 ENCOUNTER — Other Ambulatory Visit: Payer: Self-pay | Admitting: Family Medicine

## 2019-06-26 MED FILL — AMLODIPINE BESYLATE 10 MG T: 10 | 90 days supply | Qty: 90 | Fill #0

## 2019-08-21 ENCOUNTER — Encounter: Payer: Self-pay | Admitting: Family Medicine

## 2019-08-21 ENCOUNTER — Ambulatory Visit (HOSPITAL_BASED_OUTPATIENT_CLINIC_OR_DEPARTMENT_OTHER)
Admission: RE | Admit: 2019-08-21 | Discharge: 2019-08-21 | Disposition: A | Discharge status: Home / Self Care | Payer: No Typology Code available for payment source | Source: Ambulatory Visit | Attending: Family Medicine | Admitting: Family Medicine

## 2019-08-21 ENCOUNTER — Other Ambulatory Visit: Payer: Self-pay

## 2019-08-21 ENCOUNTER — Ambulatory Visit (INDEPENDENT_AMBULATORY_CARE_PROVIDER_SITE_OTHER): Payer: No Typology Code available for payment source | Admitting: Family Medicine

## 2019-08-21 VITALS — BP 110/68 | HR 71 | Temp 97.8°F | Resp 18 | Ht 60.0 in | Wt 147.0 lb

## 2019-08-21 DIAGNOSIS — Z0000 Encounter for general adult medical examination without abnormal findings: Secondary | ICD-10-CM

## 2019-08-21 DIAGNOSIS — D509 Iron deficiency anemia, unspecified: Secondary | ICD-10-CM | POA: Diagnosis not present

## 2019-08-21 DIAGNOSIS — M79672 Pain in left foot: Secondary | ICD-10-CM | POA: Diagnosis not present

## 2019-08-21 DIAGNOSIS — E785 Hyperlipidemia, unspecified: Secondary | ICD-10-CM | POA: Diagnosis not present

## 2019-08-21 DIAGNOSIS — K219 Gastro-esophageal reflux disease without esophagitis: Secondary | ICD-10-CM | POA: Diagnosis not present

## 2019-08-21 DIAGNOSIS — I10 Essential (primary) hypertension: Secondary | ICD-10-CM | POA: Diagnosis not present

## 2019-08-21 DIAGNOSIS — L309 Dermatitis, unspecified: Secondary | ICD-10-CM

## 2019-08-21 DIAGNOSIS — Z23 Encounter for immunization: Secondary | ICD-10-CM | POA: Diagnosis not present

## 2019-08-21 DIAGNOSIS — R7989 Other specified abnormal findings of blood chemistry: Secondary | ICD-10-CM

## 2019-08-21 LAB — COMPREHENSIVE METABOLIC PANEL
ALT: 14 U/L (ref 0–35)
AST: 15 U/L (ref 0–37)
Albumin: 4.5 g/dL (ref 3.5–5.2)
Alkaline Phosphatase: 83 U/L (ref 39–117)
BUN: 13 mg/dL (ref 6–23)
CO2: 28 mEq/L (ref 19–32)
Calcium: 9.7 mg/dL (ref 8.4–10.5)
Chloride: 99 mEq/L (ref 96–112)
Creatinine, Ser: 0.7 mg/dL (ref 0.40–1.20)
GFR: 88.35 mL/min (ref >60.00)
Glucose, Bld: 93 mg/dL (ref 70–99)
Potassium: 3.7 mEq/L (ref 3.5–5.1)
Sodium: 137 mEq/L (ref 135–145)
Total Bilirubin: 0.6 mg/dL (ref 0.2–1.2)
Total Protein: 7.5 g/dL (ref 6.0–8.3)

## 2019-08-21 LAB — LIPID PANEL
Cholesterol: 227 mg/dL — ABNORMAL HIGH (ref 0–200)
HDL: 54.9 mg/dL (ref >39.00)
LDL Cholesterol: 156 mg/dL — ABNORMAL HIGH (ref 0–99)
NonHDL: 172.33
Total CHOL/HDL Ratio: 4
Triglycerides: 82 mg/dL (ref 0.0–149.0)
VLDL: 16.4 mg/dL (ref 0.0–40.0)

## 2019-08-21 LAB — CBC
HCT: 38.5 % (ref 36.0–46.0)
Hemoglobin: 13 g/dL (ref 12.0–15.0)
MCHC: 33.6 g/dL (ref 30.0–36.0)
MCV: 84.4 fl (ref 78.0–100.0)
Platelets: 336 10*3/uL (ref 150.0–400.0)
RBC: 4.56 Mil/uL (ref 3.87–5.11)
RDW: 13.3 % (ref 11.5–15.5)
WBC: 6.3 10*3/uL (ref 4.0–10.5)

## 2019-08-21 LAB — TSH: TSH: 3.57 u[IU]/mL (ref 0.35–4.50)

## 2019-08-21 MED ORDER — TRIAMCINOLONE ACETONIDE 0.1 % EX CREA: 1.0000 "application " | TOPICAL_CREAM | Freq: Two times a day (BID) | CUTANEOUS | 2 refills | Status: DC

## 2019-08-21 MED ORDER — OMEPRAZOLE 40 MG PO CPDR: 40.0000 mg | DELAYED_RELEASE_CAPSULE | Freq: Every day | ORAL | 5 refills | Status: DC | PRN

## 2019-08-21 MED FILL — OMEPRAZOLE 40 MG CPDR: 40 | 30 days supply | Qty: 30 | Fill #0

## 2019-08-21 MED FILL — TRIAMCINOLONE ACETONIDE 0.1: 0.1 | 30 days supply | Qty: 80 | Fill #0

## 2019-08-21 NOTE — Assessment & Plan Note (Signed)
Given refill on Triamcinolone cream

## 2019-08-21 NOTE — Assessment & Plan Note (Signed)
Patient encouraged to maintain heart healthy diet, regular exercise, adequate sleep. Consider daily probiotics. Take medications as prescribed 

## 2019-08-21 NOTE — Assessment & Plan Note (Signed)
Well controlled, no changes to meds. Encouraged heart healthy diet such as the DASH diet and exercise as tolerated.  °

## 2019-08-21 NOTE — Assessment & Plan Note (Signed)
Encouraged heart healthy diet, increase exercise, avoid trans fats, consider a krill oil cap daily 

## 2019-08-21 NOTE — Assessment & Plan Note (Signed)
Xray unremarkable she is encouraged to try different foot wear and try topical creams such as Lidocaine. If worsens she will call for referral to podiatry or sports medicine.

## 2019-08-21 NOTE — Assessment & Plan Note (Addendum)
Avoid offending foods, start probiotics. Do not eat large meals in late evening and consider raising head of bed. Refill given on Omeprazole ?

## 2019-08-21 NOTE — Assessment & Plan Note (Signed)
Labs ordered to monitor

## 2019-08-21 NOTE — Assessment & Plan Note (Signed)
Monitor labs ordered

## 2019-08-21 NOTE — Patient Instructions (Signed)
Pulse oximeter   Omron blood pressure, upper arm cuff  Weekly vitals  Preventive Care 50-50 Years Old, Female Preventive care refers to visits with your health care provider and lifestyle choices that can promote health and wellness. This includes:  A yearly physical exam. This may also be called an annual well check.  Regular dental visits and eye exams.  Immunizations.  Screening for certain conditions.  Healthy lifestyle choices, such as eating a healthy diet, getting regular exercise, not using drugs or products that contain nicotine and tobacco, and limiting alcohol use. What can I expect for my preventive care visit? Physical exam Your health care provider will check your:  Height and weight. This may be used to calculate body mass index (BMI), which tells if you are at a healthy weight.  Heart rate and blood pressure.  Skin for abnormal spots. Counseling Your health care provider may ask you questions about your:  Alcohol, tobacco, and drug use.  Emotional well-being.  Home and relationship well-being.  Sexual activity.  Eating habits.  Work and work Statistician.  Method of birth control.  Menstrual cycle.  Pregnancy history. What immunizations do I need?  Influenza (flu) vaccine  This is recommended every year. Tetanus, diphtheria, and pertussis (Tdap) vaccine  You may need a Td booster every 10 years. Varicella (chickenpox) vaccine  You may need this if you have not been vaccinated. Zoster (shingles) vaccine  You may need this after age 29. Measles, mumps, and rubella (MMR) vaccine  You may need at least one dose of MMR if you were born in 1957 or later. You may also need a second dose. Pneumococcal conjugate (PCV13) vaccine  You may need this if you have certain conditions and were not previously vaccinated. Pneumococcal polysaccharide (PPSV23) vaccine  You may need one or two doses if you smoke cigarettes or if you have certain  conditions. Meningococcal conjugate (MenACWY) vaccine  You may need this if you have certain conditions. Hepatitis A vaccine  You may need this if you have certain conditions or if you travel or work in places where you may be exposed to hepatitis A. Hepatitis B vaccine  You may need this if you have certain conditions or if you travel or work in places where you may be exposed to hepatitis B. Haemophilus influenzae type b (Hib) vaccine  You may need this if you have certain conditions. Human papillomavirus (HPV) vaccine  If recommended by your health care provider, you may need three doses over 6 months. You may receive vaccines as individual doses or as more than one vaccine together in one shot (combination vaccines). Talk with your health care provider about the risks and benefits of combination vaccines. What tests do I need? Blood tests  Lipid and cholesterol levels. These may be checked every 5 years, or more frequently if you are over 45 years old.  Hepatitis C test.  Hepatitis B test. Screening  Lung cancer screening. You may have this screening every year starting at age 50 if you have a 30-pack-year history of smoking and currently smoke or have quit within the past 15 years.  Colorectal cancer screening. All adults should have this screening starting at age 50 and continuing until age 50. Your health care provider may recommend screening at age 38 if you are at increased risk. You will have tests every 1-10 years, depending on your results and the type of screening test.  Diabetes screening. This is done by checking your blood sugar (  glucose) after you have not eaten for a while (fasting). You may have this done every 1-3 years.  Mammogram. This may be done every 1-2 years. Talk with your health care provider about when you should start having regular mammograms. This may depend on whether you have a family history of breast cancer.  BRCA-related cancer screening. This  may be done if you have a family history of breast, ovarian, tubal, or peritoneal cancers.  Pelvic exam and Pap test. This may be done every 3 years starting at age 45. Starting at age 50, this may be done every 5 years if you have a Pap test in combination with an HPV test. Other tests  Sexually transmitted disease (STD) testing.  Bone density scan. This is done to screen for osteoporosis. You may have this scan if you are at high risk for osteoporosis. Follow these instructions at home: Eating and drinking  Eat a diet that includes fresh fruits and vegetables, whole grains, lean protein, and low-fat dairy.  Take vitamin and mineral supplements as recommended by your health care provider.  Do not drink alcohol if: ? Your health care provider tells you not to drink. ? You are pregnant, may be pregnant, or are planning to become pregnant.  If you drink alcohol: ? Limit how much you have to 0-1 drink a day. ? Be aware of how much alcohol is in your drink. In the [place] one drink equals one 12 oz bottle of beer (355 mL), one 5 oz glass of wine (148 mL), or one 1 oz glass of hard liquor (44 mL). Lifestyle  Take daily care of your teeth and gums.  Stay active. Exercise for at least 30 minutes on 5 or more days each week.  Do not use any products that contain nicotine or tobacco, such as cigarettes, e-cigarettes, and chewing tobacco. If you need help quitting, ask your health care provider.  If you are sexually active, practice safe sex. Use a condom or other form of birth control (contraception) in order to prevent pregnancy and STIs (sexually transmitted infections).  If told by your health care provider, take low-dose aspirin daily starting at age 36. What's next?  Visit your health care provider once a year for a well check visit.  Ask your health care provider how often you should have your eyes and teeth checked.  Stay up to date on all vaccines. This information is not  intended to replace advice given to you by your health care provider. Make sure you discuss any questions you have with your health care provider. Document Released: 12/11/2015 Document Revised: 07/26/2018 Document Reviewed: 07/26/2018 Elsevier Patient Education  2020 [employer].

## 2019-08-21 NOTE — Progress Notes (Signed)
Subjective:    Patient ID: Tara Greene, female    DOB: 11-15-1971, 48 y.o.   MRN: [id number]  Chief Complaint  Patient presents with  . Annual Exam    HPI Patient is in today for annual preventative exam and follow up on chronic medical concerns including hypertension, hyperlipidemia, reflux and more. She feels well today. No recent febrile illness or hospitalizations. She needs a refill on her Omeprazole which helps her reflux. She is working during the pandemic but denies any concerning symptoms. Is trying to eat well and stay active. Denies CP/palp/SOB/HA/congestion/fevers/GI or GU c/o. Taking meds as prescribed  Past Medical History:  Diagnosis Date  . Abnormal TSH 06/14/2015  . Allergy   . Anemia   . Dehydration 01/27/2017  . Dermatitis 10/18/2013  . GERD (gastroesophageal reflux disease)   . Hyperlipidemia   . Other and unspecified hyperlipidemia 10/18/2013  . Perimenopause 04/12/2014    History reviewed. No pertinent surgical history.  Family History  Problem Relation Age of Onset  . Hypertension Mother   . Hyperlipidemia Mother   . Cancer Mother 82       colon   . COPD Father   . Breast cancer Neg Hx     Social History   Socioeconomic History  . Marital status: Married    Spouse name: Not on file  . Number of children: 3  . Years of education: Not on file  . Highest education level: Not on file  Occupational History  . Occupation: Therapist, sports- [employer]  Social Needs  . Financial resource strain: Not on file  . Food insecurity    Worry: Not on file    Inability: Not on file  . Transportation needs    Medical: Not on file    Non-medical: Not on file  Tobacco Use  . Smoking status: Never Smoker  . Smokeless tobacco: Never Used  Substance and Sexual Activity  . Alcohol use: No  . Drug use: No  . 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 Physical activity    Days per week: Not on file     Minutes per session: Not on file  . Stress: Not on file  Relationships  . Social Herbalist on phone: Not on file    Gets together: Not on file    Attends religious service: Not on file    Active member of club or organization: Not on file    Attends meetings of clubs or organizations: Not on file    Relationship status: Not on file  . Intimate partner violence    Fear of current or ex partner: Not on file    Emotionally abused: Not on file    Physically abused: Not on file    Forced sexual activity: Not on file  Other Topics Concern  . Not on file  Social History Narrative  . Not on file    Outpatient Medications Prior to Visit  Medication Sig Dispense Refill  . amLODipine (NORVASC) 10 MG tablet TAKE 1 TABLET (10 MG TOTAL) BY MOUTH DAILY. 90 tablet 1  . cholecalciferol (VITAMIN D) 1000 units tablet Take 1 tablet (1,000 Units total) by mouth daily.    . fexofenadine (ALLEGRA) 180 MG tablet Take 1 tablet (180 mg total) by mouth daily.    . fluticasone (FLONASE) 50 MCG/ACT nasal spray Place 1 spray into both nostrils daily.  2  . Multiple Vitamin (MULTIVITAMIN) tablet Take 1 tablet by mouth daily.      Marland Kitchen omeprazole (PRILOSEC) 40 MG capsule Take 1 capsule (40 mg total) by mouth daily as needed. 30 capsule 5  . triamcinolone cream (KENALOG) 0.1 % Apply 1 application topically 2 (two) times daily. As needed for rash 85.2 g 2   No facility-administered medications prior to visit.     No Known Allergies  Review of Systems  Constitutional: Negative for chills, fever and malaise/fatigue.  HENT: Negative for congestion and hearing loss.   Eyes: Negative for discharge.  Respiratory: Negative for cough, sputum production and shortness of breath.   Cardiovascular: Negative for chest pain, palpitations and leg swelling.  Gastrointestinal: Positive for heartburn. Negative for abdominal pain, blood in stool, constipation, diarrhea, nausea and vomiting.  Genitourinary: Negative  for dysuria, frequency, hematuria and urgency.  Musculoskeletal: Negative for back pain, falls and myalgias.  Skin: Negative for rash.  Neurological: Negative for dizziness, sensory change, loss of consciousness, weakness and headaches.  Endo/Heme/Allergies: Negative for environmental allergies. Does not bruise/bleed easily.  Psychiatric/Behavioral: Negative for depression and suicidal ideas. The patient is not nervous/anxious and does not have insomnia.        Objective:    Physical Exam Constitutional:      General: She is not in acute distress.    Appearance: She is well-developed.  HENT:     Head: Normocephalic and atraumatic.  Eyes:     Conjunctiva/sclera: Conjunctivae normal.  Neck:     Musculoskeletal: Neck supple.     Thyroid: No thyromegaly.  Cardiovascular:     Rate and Rhythm: Normal rate and regular rhythm.     Heart sounds: Normal heart sounds. No murmur.  Pulmonary:     Effort: Pulmonary effort is normal. No respiratory distress.     Breath sounds: Normal breath sounds.  Abdominal:     General: Bowel sounds are normal. There is no distension.     Palpations: Abdomen is soft. There is no mass.     Tenderness: There is no abdominal tenderness.  Lymphadenopathy:     Cervical: No cervical adenopathy.  Skin:    General: Skin is warm and dry.  Neurological:     Mental Status: She is alert and oriented to person, place, and time.  Psychiatric:        Behavior: Behavior normal.     BP 110/68 (BP Location: Left Arm, Patient Position: Sitting, Cuff Size: Normal)   Pulse 71   Temp 97.8 F (36.6 C) (Temporal)   Resp 18   Ht 5' (1.524 m)   Wt 147 lb (66.7 kg)   LMP 01/08/2016 Comment: Irregular per patient  SpO2 99%   BMI 28.71 kg/m  Wt Readings from Last 3 Encounters:  08/21/19 147 lb (66.7 kg)  04/27/18 140 lb (63.5 kg)  08/21/17 145 lb 12.8 oz (66.1 kg)    Diabetic Foot Exam - Simple   No data filed     Lab Results  Component Value Date   WBC 6.3  08/21/2019   HGB 13.0 08/21/2019   HCT 38.5 08/21/2019   PLT 336.0 08/21/2019   GLUCOSE 93 08/21/2019   CHOL 227 (H) 08/21/2019   TRIG 82.0 08/21/2019   HDL 54.90 08/21/2019   LDLCALC 156 (H) 08/21/2019   ALT 14 08/21/2019   AST 15 08/21/2019   NA 137 08/21/2019   K 3.7 08/21/2019   CL 99 08/21/2019   CREATININE 0.70 08/21/2019  BUN 13 08/21/2019   CO2 28 08/21/2019   TSH 3.57 08/21/2019    Lab Results  Component Value Date   TSH 3.57 08/21/2019   Lab Results  Component Value Date   WBC 6.3 08/21/2019   HGB 13.0 08/21/2019   HCT 38.5 08/21/2019   MCV 84.4 08/21/2019   PLT 336.0 08/21/2019   Lab Results  Component Value Date   NA 137 08/21/2019   K 3.7 08/21/2019   CO2 28 08/21/2019   GLUCOSE 93 08/21/2019   BUN 13 08/21/2019   CREATININE 0.70 08/21/2019   BILITOT 0.6 08/21/2019   ALKPHOS 83 08/21/2019   AST 15 08/21/2019   ALT 14 08/21/2019   PROT 7.5 08/21/2019   ALBUMIN 4.5 08/21/2019   CALCIUM 9.7 08/21/2019   GFR 88.35 08/21/2019   Lab Results  Component Value Date   CHOL 227 (H) 08/21/2019   Lab Results  Component Value Date   HDL 54.90 08/21/2019   Lab Results  Component Value Date   LDLCALC 156 (H) 08/21/2019   Lab Results  Component Value Date   TRIG 82.0 08/21/2019   Lab Results  Component Value Date   CHOLHDL 4 08/21/2019   No results found for: HGBA1C     Assessment & Plan:   Problem List Items Addressed This Visit    Preventative health care - Primary    Patient encouraged to maintain heart healthy diet, regular exercise, adequate sleep. Consider daily probiotics. Take medications as prescribed      Anemia    Labs ordered to monitor      Hyperlipidemia    Encouraged heart healthy diet, increase exercise, avoid trans fats, consider a krill oil cap daily      Relevant Orders   Lipid panel (Completed)   Eczema    Given refill on Triamcinolone cream      Relevant Medications   triamcinolone cream (KENALOG) 0.1 %    Abnormal TSH    Monitor labs ordered      HTN (hypertension)    Well controlled, no changes to meds. Encouraged heart healthy diet such as the DASH diet and exercise as tolerated.       Relevant Orders   CBC (Completed)   Comprehensive metabolic panel (Completed)   TSH (Completed)   GERD (gastroesophageal reflux disease)    Avoid offending foods, start probiotics. Do not eat large meals in late evening and consider raising head of bed. Refill given on Omeprazole      Relevant Medications   omeprazole (PRILOSEC) 40 MG capsule   Left foot pain    Xray unremarkable she is encouraged to try different foot wear and try topical creams such as Lidocaine. If worsens she will call for referral to podiatry or sports medicine.       Relevant Orders   DG Foot Complete Left (Completed)    Other Visit Diagnoses    Needs flu shot       Relevant Orders   Flu Vaccine QUAD 6+ mos PF IM (Fluarix Quad PF) (Completed)      I am having Delanie B. Hanko maintain her multivitamin, fluticasone, fexofenadine, cholecalciferol, amLODipine, triamcinolone cream, and omeprazole.  Meds ordered this encounter  Medications  . triamcinolone cream (KENALOG) 0.1 %    Sig: Apply 1 application topically 2 (two) times daily. As needed for rash    Dispense:  85.2 g    Refill:  2  . omeprazole (PRILOSEC) 40 MG capsule    Sig: Take  1 capsule (40 mg total) by mouth daily as needed.    Dispense:  30 capsule    Refill:  5     Penni Homans, MD

## 2019-08-29 ENCOUNTER — Encounter: Payer: Self-pay | Admitting: Family Medicine

## 2019-10-03 MED FILL — OMEPRAZOLE 40 MG CPDR: 40 | 30 days supply | Qty: 30 | Fill #1

## 2019-10-03 MED FILL — AMLODIPINE BESYLATE 10 MG T: 10 | 90 days supply | Qty: 90 | Fill #1

## 2019-12-13 MED FILL — OMEPRAZOLE 40 MG CPDR: 40 | 30 days supply | Qty: 30 | Fill #2

## 2019-12-23 MED FILL — OMEPRAZOLE 40 MG CPDR: 40 | 30 days supply | Qty: 30 | Fill #2

## 2020-01-24 ENCOUNTER — Other Ambulatory Visit: Payer: Self-pay | Admitting: Family Medicine

## 2020-01-24 MED FILL — AMLODIPINE BESYLATE 10 MG T: 10 | 90 days supply | Qty: 90 | Fill #0

## 2020-01-24 MED FILL — OMEPRAZOLE 40 MG CPDR: 40 | 30 days supply | Qty: 30 | Fill #3

## 2020-03-12 MED FILL — OMEPRAZOLE 40 MG CPDR: 40 | 30 days supply | Qty: 30 | Fill #4

## 2020-07-02 ENCOUNTER — Other Ambulatory Visit: Payer: Self-pay | Admitting: Family Medicine

## 2020-07-02 ENCOUNTER — Other Ambulatory Visit: Payer: Self-pay

## 2020-07-02 MED ORDER — OMEPRAZOLE 40 MG PO CPDR: 40.0000 mg | DELAYED_RELEASE_CAPSULE | Freq: Every day | ORAL | 1 refills | Status: DC | PRN

## 2020-07-02 MED FILL — OMEPRAZOLE 40 MG CPDR: 40 | 30 days supply | Qty: 30 | Fill #0

## 2020-08-12 ENCOUNTER — Other Ambulatory Visit: Payer: Self-pay | Admitting: Family Medicine

## 2020-08-12 DIAGNOSIS — Z1231 Encounter for screening mammogram for malignant neoplasm of breast: Secondary | ICD-10-CM

## 2020-08-17 ENCOUNTER — Other Ambulatory Visit: Payer: Self-pay | Admitting: Family Medicine

## 2020-08-17 MED FILL — OMEPRAZOLE 40 MG CPDR: 40 | 30 days supply | Qty: 30 | Fill #1

## 2020-08-17 MED FILL — AMLODIPINE BESYLATE 10 MG T: 10 | 90 days supply | Qty: 90 | Fill #0

## 2020-08-24 ENCOUNTER — Other Ambulatory Visit: Payer: Self-pay

## 2020-08-24 ENCOUNTER — Encounter: Payer: Self-pay | Admitting: Family Medicine

## 2020-08-24 ENCOUNTER — Ambulatory Visit (INDEPENDENT_AMBULATORY_CARE_PROVIDER_SITE_OTHER): Payer: 59 | Admitting: Family Medicine

## 2020-08-24 VITALS — BP 112/64 | HR 76 | Temp 97.9°F | Resp 12 | Ht 60.0 in | Wt 146.8 lb

## 2020-08-24 DIAGNOSIS — L309 Dermatitis, unspecified: Secondary | ICD-10-CM | POA: Diagnosis not present

## 2020-08-24 DIAGNOSIS — I10 Essential (primary) hypertension: Secondary | ICD-10-CM

## 2020-08-24 DIAGNOSIS — Z0000 Encounter for general adult medical examination without abnormal findings: Secondary | ICD-10-CM | POA: Diagnosis not present

## 2020-08-24 DIAGNOSIS — E782 Mixed hyperlipidemia: Secondary | ICD-10-CM | POA: Diagnosis not present

## 2020-08-24 DIAGNOSIS — Z1211 Encounter for screening for malignant neoplasm of colon: Secondary | ICD-10-CM

## 2020-08-24 MED ORDER — TRIAMCINOLONE ACETONIDE 0.1 % EX CREA: 1.0000 "application " | TOPICAL_CREAM | Freq: Two times a day (BID) | CUTANEOUS | 2 refills | Status: DC

## 2020-08-24 MED FILL — TRIAMCINOLONE ACETONIDE 0.1: 0.1 | 30 days supply | Qty: 80 | Fill #0

## 2020-08-24 NOTE — Progress Notes (Signed)
Subjective:    Patient ID: Tara Greene, female    DOB: 12/21/1970, 49 y.o.   MRN: [id number]  Chief Complaint  Patient presents with  . Annual Exam    HPI Patient is in today for annual preventative exam and follow upon chronic medical concerns. She continues to work on [employer] at [employer] and acknowledges being tired during the time of COVID. She is managing and is trying to eat right and get her rest. She tolerated her COVID shots. No acute concerns although she does request a refill on Kenalog to help with an occasional rash she gets on her neck. It is itchy when it occurs. Denies CP/palp/SOB/HA/congestion/fevers/GI or GU c/o. Taking meds as prescribed  Past Medical History:  Diagnosis Date  . Abnormal TSH 06/14/2015  . Allergy   . Anemia   . Dehydration 01/27/2017  . Dermatitis 10/18/2013  . GERD (gastroesophageal reflux disease)   . Hyperlipidemia   . Other and unspecified hyperlipidemia 10/18/2013  . Perimenopause 04/12/2014    History reviewed. No pertinent surgical history.  Family History  Problem Relation Age of Onset  . Hypertension Mother   . Hyperlipidemia Mother   . Cancer Mother 109       colon   . COPD Father   . Breast cancer Neg Hx     Social History   Socioeconomic History  . Marital status: Married    Spouse name: Not on file  . Number of children: 3  . Years of education: Not on file  . Highest education level: Not on file  Occupational History  . Occupation: RN- [employer]  Tobacco Use  . Smoking status: Never Smoker  . Smokeless tobacco: Never Used  Substance and Sexual Activity  . Alcohol use: No  . Drug use: No  . 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 Not on file  Social History Narrative  . Not on file   Social Determinants of Health   Financial Resource Strain:   . Difficulty of Paying Living Expenses: Not on file  Food Insecurity:   . Worried About  Charity fundraiser in the Last Year: Not on file  . Ran Out of Food in the Last Year: Not on file  Transportation Needs:   . Lack of Transportation (Medical): Not on file  . Lack of Transportation (Non-Medical): Not on file  Physical Activity:   . Days of Exercise per Week: Not on file  . Minutes of Exercise per Session: Not on file  Stress:   . Feeling of Stress : Not on file  Social Connections:   . Frequency of Communication with Friends and Family: Not on file  . Frequency of Social Gatherings with Friends and Family: Not on file  . Attends Religious Services: Not on file  . Active Member of Clubs or Organizations: Not on file  . Attends Archivist Meetings: Not on file  . Marital Status: Not on file  Intimate Partner Violence:   . Fear of Current or Ex-Partner: Not on file  . Emotionally Abused: Not on file  . Physically Abused: Not on file  . Sexually Abused: Not on file    Outpatient Medications Prior to Visit  Medication Sig Dispense Refill  . amLODipine (NORVASC) 10 MG tablet TAKE 1 TABLET BY MOUTH ONCE DAILY 90 tablet 1  . cholecalciferol (VITAMIN D) 1000 units tablet Take  1 tablet (1,000 Units total) by mouth daily.    . fexofenadine (ALLEGRA) 180 MG tablet Take 1 tablet (180 mg total) by mouth daily.    . fluticasone (FLONASE) 50 MCG/ACT nasal spray Place 1 spray into both nostrils daily.  2  . Multiple Vitamin (MULTIVITAMIN) tablet Take 1 tablet by mouth daily.      Marland Kitchen omeprazole (PRILOSEC) 40 MG capsule Take 1 capsule (40 mg total) by mouth daily as needed. 30 capsule 1  . triamcinolone cream (KENALOG) 0.1 % Apply 1 application topically 2 (two) times daily. As needed for rash 85.2 g 2   No facility-administered medications prior to visit.    No Known Allergies  Review of Systems  Constitutional: Negative for chills and fever.  HENT: Negative for congestion and hearing loss.   Eyes: Negative for discharge.  Respiratory: Negative for cough, sputum  production and shortness of breath.   Cardiovascular: Negative for chest pain, palpitations and leg swelling.  Gastrointestinal: Negative for abdominal pain, blood in stool, constipation, diarrhea, heartburn, nausea and vomiting.  Genitourinary: Negative for dysuria, frequency, hematuria and urgency.  Musculoskeletal: Negative for back pain, falls and myalgias.  Skin: Positive for itching and rash.  Neurological: Negative for dizziness, sensory change, loss of consciousness, weakness and headaches.  Endo/Heme/Allergies: Negative for environmental allergies. Does not bruise/bleed easily.  Psychiatric/Behavioral: Negative for depression and suicidal ideas. The patient is not nervous/anxious and does not have insomnia.        Objective:    Physical Exam Constitutional:      General: She is not in acute distress.    Appearance: She is well-developed.  HENT:     Head: Normocephalic and atraumatic.  Eyes:     Conjunctiva/sclera: Conjunctivae normal.  Neck:     Thyroid: No thyromegaly.  Cardiovascular:     Rate and Rhythm: Normal rate and regular rhythm.     Heart sounds: Normal heart sounds. No murmur heard.   Pulmonary:     Effort: Pulmonary effort is normal. No respiratory distress.     Breath sounds: Normal breath sounds.  Abdominal:     General: Bowel sounds are normal. There is no distension.     Palpations: Abdomen is soft. There is no mass.     Tenderness: There is no abdominal tenderness.  Musculoskeletal:     Cervical back: Neck supple.  Lymphadenopathy:     Cervical: No cervical adenopathy.  Skin:    General: Skin is warm and dry.     Findings: Rash present.     Comments: Small rough patches over posterior lower neck. No erythema of obvious lesions  Neurological:     Mental Status: She is alert and oriented to person, place, and time.  Psychiatric:        Behavior: Behavior normal.     BP 112/64 (BP Location: Left Arm, Patient Position: Sitting, Cuff Size: Large)    Pulse 76   Temp 97.9 F (36.6 C) (Oral)   Resp 12   Ht 5' (1.524 m)   Wt 146 lb 12.8 oz (66.6 kg)   LMP 01/08/2016 Comment: Irregular per patient  SpO2 98%   BMI 28.67 kg/m  Wt Readings from Last 3 Encounters:  08/24/20 146 lb 12.8 oz (66.6 kg)  08/21/19 147 lb (66.7 kg)  04/27/18 140 lb (63.5 kg)    Diabetic Foot Exam - Simple   No data filed     Lab Results  Component Value Date   WBC 6.3 08/21/2019  HGB 13.0 08/21/2019   HCT 38.5 08/21/2019   PLT 336.0 08/21/2019   GLUCOSE 93 08/21/2019   CHOL 227 (H) 08/21/2019   TRIG 82.0 08/21/2019   HDL 54.90 08/21/2019   LDLCALC 156 (H) 08/21/2019   ALT 14 08/21/2019   AST 15 08/21/2019   NA 137 08/21/2019   K 3.7 08/21/2019   CL 99 08/21/2019   CREATININE 0.70 08/21/2019   BUN 13 08/21/2019   CO2 28 08/21/2019   TSH 3.57 08/21/2019    Lab Results  Component Value Date   TSH 3.57 08/21/2019   Lab Results  Component Value Date   WBC 6.3 08/21/2019   HGB 13.0 08/21/2019   HCT 38.5 08/21/2019   MCV 84.4 08/21/2019   PLT 336.0 08/21/2019   Lab Results  Component Value Date   NA 137 08/21/2019   K 3.7 08/21/2019   CO2 28 08/21/2019   GLUCOSE 93 08/21/2019   BUN 13 08/21/2019   CREATININE 0.70 08/21/2019   BILITOT 0.6 08/21/2019   ALKPHOS 83 08/21/2019   AST 15 08/21/2019   ALT 14 08/21/2019   PROT 7.5 08/21/2019   ALBUMIN 4.5 08/21/2019   CALCIUM 9.7 08/21/2019   GFR 88.35 08/21/2019   Lab Results  Component Value Date   CHOL 227 (H) 08/21/2019   Lab Results  Component Value Date   HDL 54.90 08/21/2019   Lab Results  Component Value Date   LDLCALC 156 (H) 08/21/2019   Lab Results  Component Value Date   TRIG 82.0 08/21/2019   Lab Results  Component Value Date   CHOLHDL 4 08/21/2019   No results found for: HGBA1C     Assessment & Plan:   Problem List Items Addressed This Visit    Preventative health care   Hyperlipidemia   Relevant Orders   Lipid panel   Eczema    Occurs  intermittently around neck, responsive to Kenalog cream sparingly, given refill to apply bid prn. If worsens can request referral to dermatology      Relevant Medications   triamcinolone cream (KENALOG) 0.1 %   HTN (hypertension)   Relevant Orders   CBC   Comprehensive metabolic panel   TSH    Other Visit Diagnoses    Colon cancer screening    -  Primary   Relevant Orders   Ambulatory referral to Gastroenterology      I am having Tara Greene maintain her multivitamin, fluticasone, fexofenadine, cholecalciferol, omeprazole, amLODipine, and triamcinolone cream.  Meds ordered this encounter  Medications  . triamcinolone cream (KENALOG) 0.1 %    Sig: Apply 1 application topically 2 (two) times daily. As needed for rash    Dispense:  85.2 g    Refill:  2     Tara Homans, MD

## 2020-08-24 NOTE — Assessment & Plan Note (Signed)
Occurs intermittently around neck, responsive to Kenalog cream sparingly, given refill to apply bid prn. If worsens can request referral to dermatology

## 2020-08-24 NOTE — Patient Instructions (Signed)
Preventive Care 40-51 Years Old, Female Preventive care refers to visits with your health care provider and lifestyle choices that can promote health and wellness. This includes:  A yearly physical exam. This may also be called an annual well check.  Regular dental visits and eye exams.  Immunizations.  Screening for certain conditions.  Healthy lifestyle choices, such as eating a healthy diet, getting regular exercise, not using drugs or products that contain nicotine and tobacco, and limiting alcohol use. What can I expect for my preventive care visit? Physical exam Your health care provider will check your:  Height and weight. This may be used to calculate body mass index (BMI), which tells if you are at a healthy weight.  Heart rate and blood pressure.  Skin for abnormal spots. Counseling Your health care provider may ask you questions about your:  Alcohol, tobacco, and drug use.  Emotional well-being.  Home and relationship well-being.  Sexual activity.  Eating habits.  Work and work environment.  Method of birth control.  Menstrual cycle.  Pregnancy history. What immunizations do I need?  Influenza (flu) vaccine  This is recommended every year. Tetanus, diphtheria, and pertussis (Tdap) vaccine  You may need a Td booster every 10 years. Varicella (chickenpox) vaccine  You may need this if you have not been vaccinated. Zoster (shingles) vaccine  You may need this after age 51. Measles, mumps, and rubella (MMR) vaccine  You may need at least one dose of MMR if you were born in 1957 or later. You may also need a second dose. Pneumococcal conjugate (PCV13) vaccine  You may need this if you have certain conditions and were not previously vaccinated. Pneumococcal polysaccharide (PPSV23) vaccine  You may need one or two doses if you smoke cigarettes or if you have certain conditions. Meningococcal conjugate (MenACWY) vaccine  You may need this if you  have certain conditions. Hepatitis A vaccine  You may need this if you have certain conditions or if you travel or work in places where you may be exposed to hepatitis A. Hepatitis B vaccine  You may need this if you have certain conditions or if you travel or work in places where you may be exposed to hepatitis B. Haemophilus influenzae type b (Hib) vaccine  You may need this if you have certain conditions. Human papillomavirus (HPV) vaccine  If recommended by your health care provider, you may need three doses over 6 months. You may receive vaccines as individual doses or as more than one vaccine together in one shot (combination vaccines). Talk with your health care provider about the risks and benefits of combination vaccines. What tests do I need? Blood tests  Lipid and cholesterol levels. These may be checked every 5 years, or more frequently if you are over 51 years old.  Hepatitis C test.  Hepatitis B test. Screening  Lung cancer screening. You may have this screening every year starting at age 51 if you have a 30-pack-year history of smoking and currently smoke or have quit within the past 15 years.  Colorectal cancer screening. All adults should have this screening starting at age 51 and continuing until age 75. Your health care provider may recommend screening at age 51 if you are at increased risk. You will have tests every 1-10 years, depending on your results and the type of screening test.  Diabetes screening. This is done by checking your blood sugar (glucose) after you have not eaten for a while (fasting). You may have this   done every 1-3 years.  Mammogram. This may be done every 1-2 years. Talk with your health care provider about when you should start having regular mammograms. This may depend on whether you have a family history of breast cancer.  BRCA-related cancer screening. This may be done if you have a family history of breast, ovarian, tubal, or peritoneal  cancers.  Pelvic exam and Pap test. This may be done every 3 years starting at age 51. Starting at age 51, this may be done every 5 years if you have a Pap test in combination with an HPV test. Other tests  Sexually transmitted disease (STD) testing.  Bone density scan. This is done to screen for osteoporosis. You may have this scan if you are at high risk for osteoporosis. Follow these instructions at home: Eating and drinking  Eat a diet that includes fresh fruits and vegetables, whole grains, lean protein, and low-fat dairy.  Take vitamin and mineral supplements as recommended by your health care provider.  Do not drink alcohol if: ? Your health care provider tells you not to drink. ? You are pregnant, may be pregnant, or are planning to become pregnant.  If you drink alcohol: ? Limit how much you have to 0-1 drink a day. ? Be aware of how much alcohol is in your drink. In the [place] one drink equals one 12 oz bottle of beer (355 mL), one 5 oz glass of wine (148 mL), or one 1 oz glass of hard liquor (44 mL). Lifestyle  Take daily care of your teeth and gums.  Stay active. Exercise for at least 30 minutes on 5 or more days each week.  Do not use any products that contain nicotine or tobacco, such as cigarettes, e-cigarettes, and chewing tobacco. If you need help quitting, ask your health care provider.  If you are sexually active, practice safe sex. Use a condom or other form of birth control (contraception) in order to prevent pregnancy and STIs (sexually transmitted infections).  If told by your health care provider, take low-dose aspirin daily starting at age 51. What's next?  Visit your health care provider once a year for a well check visit.  Ask your health care provider how often you should have your eyes and teeth checked.  Stay up to date on all vaccines. This information is not intended to replace advice given to you by your health care provider. Make sure you  discuss any questions you have with your health care provider. Document Revised: 07/26/2018 Document Reviewed: 07/26/2018 Elsevier Patient Education  2020 [employer].

## 2020-08-25 LAB — COMPREHENSIVE METABOLIC PANEL
AG Ratio: 1.5 (calc) (ref 1.0–2.5)
ALT: 26 U/L (ref 6–29)
AST: 20 U/L (ref 10–35)
Albumin: 4.3 g/dL (ref 3.6–5.1)
Alkaline phosphatase (APISO): 84 U/L (ref 37–153)
BUN: 14 mg/dL (ref 7–25)
CO2: 25 mmol/L (ref 20–32)
Calcium: 9.2 mg/dL (ref 8.6–10.4)
Chloride: 104 mmol/L (ref 98–110)
Creat: 0.86 mg/dL (ref 0.50–1.05)
Globulin: 2.8 g/dL (calc) (ref 1.9–3.7)
Glucose, Bld: 101 mg/dL — ABNORMAL HIGH (ref 65–99)
Potassium: 3.8 mmol/L (ref 3.5–5.3)
Sodium: 140 mmol/L (ref 135–146)
Total Bilirubin: 0.5 mg/dL (ref 0.2–1.2)
Total Protein: 7.1 g/dL (ref 6.1–8.1)

## 2020-08-25 LAB — LIPID PANEL
Cholesterol: 238 mg/dL — ABNORMAL HIGH (ref <200)
HDL: 51 mg/dL (ref >=50)
LDL Cholesterol (Calc): 165 mg/dL (calc) — ABNORMAL HIGH
Non-HDL Cholesterol (Calc): 187 mg/dL (calc) — ABNORMAL HIGH (ref <130)
Total CHOL/HDL Ratio: 4.7 (calc) (ref <5.0)
Triglycerides: 104 mg/dL (ref <150)

## 2020-08-25 LAB — CBC
HCT: 39.8 % (ref 35.0–45.0)
Hemoglobin: 13.3 g/dL (ref 11.7–15.5)
MCH: 28.5 pg (ref 27.0–33.0)
MCHC: 33.4 g/dL (ref 32.0–36.0)
MCV: 85.2 fL (ref 80.0–100.0)
MPV: 9.4 fL (ref 7.5–12.5)
Platelets: 328 10*3/uL (ref 140–400)
RBC: 4.67 10*6/uL (ref 3.80–5.10)
RDW: 13 % (ref 11.0–15.0)
WBC: 5.5 10*3/uL (ref 3.8–10.8)

## 2020-08-25 LAB — TSH: TSH: 1.49 mIU/L

## 2020-08-27 ENCOUNTER — Other Ambulatory Visit: Payer: Self-pay

## 2020-08-27 ENCOUNTER — Ambulatory Visit
Admission: RE | Admit: 2020-08-27 | Discharge: 2020-08-27 | Disposition: A | Discharge status: Home / Self Care | Payer: No Typology Code available for payment source | Source: Ambulatory Visit | Attending: Family Medicine | Admitting: Family Medicine

## 2020-08-27 DIAGNOSIS — Z1231 Encounter for screening mammogram for malignant neoplasm of breast: Secondary | ICD-10-CM

## 2020-09-11 ENCOUNTER — Ambulatory Visit: Payer: 59 | Attending: Internal Medicine

## 2020-09-11 ENCOUNTER — Other Ambulatory Visit (HOSPITAL_BASED_OUTPATIENT_CLINIC_OR_DEPARTMENT_OTHER): Payer: Self-pay | Admitting: Internal Medicine

## 2020-09-11 DIAGNOSIS — Z23 Encounter for immunization: Secondary | ICD-10-CM

## 2020-09-11 NOTE — Progress Notes (Signed)
   Covid-19 Vaccination Clinic  Name:  MONICA CODD    MRN: [id number] DOB: 05-19-1971  09/11/2020  Ms. Mahl was observed post Covid-19 immunization for 15 minutes without incident. She was provided with Vaccine Information Sheet and instruction to access the V-Safe system. Vaccinated by Levi Aland.    Ms. Becka was instructed to call [phone] with any severe reactions post vaccine: Marland Kitchen Difficulty breathing  . Swelling of face and throat  . A fast heartbeat  . A bad rash all over body  . Dizziness and weakness

## 2020-09-14 ENCOUNTER — Encounter: Payer: Self-pay | Admitting: Gastroenterology

## 2020-09-21 MED FILL — PFIZER-BIONTECH COVID-19 VA: 30 | 1 days supply | Qty: 0 | Fill #0

## 2020-11-05 ENCOUNTER — Ambulatory Visit (INDEPENDENT_AMBULATORY_CARE_PROVIDER_SITE_OTHER): Payer: 59 | Admitting: Gastroenterology

## 2020-11-05 ENCOUNTER — Encounter: Payer: Self-pay | Admitting: Gastroenterology

## 2020-11-05 ENCOUNTER — Other Ambulatory Visit: Payer: Self-pay | Admitting: Gastroenterology

## 2020-11-05 VITALS — BP 132/82 | HR 70 | Ht 60.0 in | Wt 149.4 lb

## 2020-11-05 DIAGNOSIS — Z1211 Encounter for screening for malignant neoplasm of colon: Secondary | ICD-10-CM | POA: Diagnosis not present

## 2020-11-05 DIAGNOSIS — Z800 Family history of malignant neoplasm of digestive organs: Secondary | ICD-10-CM

## 2020-11-05 DIAGNOSIS — K219 Gastro-esophageal reflux disease without esophagitis: Secondary | ICD-10-CM

## 2020-11-05 MED ORDER — SUPREP BOWEL PREP KIT 17.5-3.13-1.6 GM/177ML PO SOLN: 1.0000 | ORAL | 0 refills | Status: DC

## 2020-11-05 MED FILL — SUPREP BOWEL PREP KIT: 17.5-3.13-1 | 30 days supply | Qty: 354 | Fill #0

## 2020-11-05 NOTE — Patient Instructions (Signed)
We have sent the following medications to your pharmacy for you to pick up at your convenience: Suprep   If you are age 51 or younger, your body mass index should be between 19-25. Your Body mass index is 29.17 kg/m. If this is out of the aformentioned range listed, please consider follow up with your Primary Care Provider.   You have been scheduled for a colonoscopy. Please follow written instructions given to you at your visit today.  Please pick up your prep supplies at the pharmacy within the next 1-3 days. If you use inhalers (even only as needed), please bring them with you on the day of your procedure.   Thank you for choosing me and [facility].  Dr. Rush Landmark

## 2020-11-06 ENCOUNTER — Encounter: Payer: Self-pay | Admitting: Gastroenterology

## 2020-11-06 DIAGNOSIS — Z1211 Encounter for screening for malignant neoplasm of colon: Secondary | ICD-10-CM | POA: Insufficient documentation

## 2020-11-06 DIAGNOSIS — Z800 Family history of malignant neoplasm of digestive organs: Secondary | ICD-10-CM | POA: Insufficient documentation

## 2020-11-06 NOTE — Progress Notes (Signed)
[place] VISIT   Primary Care Provider Mosie Lukes, MD [address] [place] [address] [phone]  Referring Provider Mosie Lukes, MD [address] [place],  [place] [address] [phone]  Patient Profile: Tara Greene is a 49 y.o. female with a pmh significant for hypertension, hyperlipidemia, allergies, GERD, family history colon cancer (mother).  The patient presents to the [facility] for an evaluation and management of problem(s) noted below:  Problem List 1. Colon cancer screening   2. Family history of colon cancer in mother   76. Gastroesophageal reflux disease, unspecified whether esophagitis present     History of Present Illness This is the patient's first visit to the outpatient [place] clinic.  The patient's mother was recently diagnosed this year with advanced stage colon cancer.  She has subsequently passed away unfortunately within just a few weeks of her diagnosis.  Patient has no other family history of GI malignancies.  She has normal bowel movements once to twice daily.  She has no blood in her stools.  She has had hemorrhoids in the past but this was at the time of her pregnancies years ago and has not experienced any recurrent bleeding.  Patient describes no issues of incomplete evacuation or fecal urgency or fecal incontinence.  The patient has GERD symptoms once weekly for which she takes Tums (PPI on her medication list but she does not take).  She denies any dysphagia or odynophagia.  She has no significant tobacco or alcohol consumption history.  The patient has never had an upper or lower endoscopy.  GI Review of Systems Positive as above Negative for nausea, vomiting, pain, change in bowel habits  Review of Systems General: Denies fevers/chills/weight loss unintentionally HEENT: Denies oral lesions Cardiovascular: Denies chest pain Pulmonary: Denies shortness  of breath Gastroenterological: See HPI Genitourinary: Denies darkened urine Hematological: Denies easy bruising/bleeding Endocrine: Denies temperature intolerance Dermatological: Denies jaundice Psychological: Mood is stable   Medications Current Outpatient Medications  Medication Sig Dispense Refill  . amLODipine (NORVASC) 10 MG tablet TAKE 1 TABLET BY MOUTH ONCE DAILY 90 tablet 1  . cholecalciferol (VITAMIN D) 1000 units tablet Take 1 tablet (1,000 Units total) by mouth daily.    . fexofenadine (ALLEGRA) 180 MG tablet Take 1 tablet (180 mg total) by mouth daily.    . fluticasone (FLONASE) 50 MCG/ACT nasal spray Place 1 spray into both nostrils daily.  2  . Multiple Vitamin (MULTIVITAMIN) tablet Take 1 tablet by mouth daily.    Marland Kitchen omeprazole (PRILOSEC) 40 MG capsule Take 1 capsule (40 mg total) by mouth daily as needed. 30 capsule 1  . triamcinolone cream (KENALOG) 0.1 % Apply 1 application topically 2 (two) times daily. As needed for rash 85.2 g 2  . Na Sulfate-K Sulfate-Mg Sulf (SUPREP BOWEL PREP KIT) 17.5-3.13-1.6 GM/177ML SOLN Take 1 kit by mouth as directed. For colonoscopy prep 354 mL 0   No current facility-administered medications for this visit.    Allergies No Known Allergies  Histories Past Medical History:  Diagnosis Date  . Abnormal TSH 06/14/2015  . Allergy   . Anemia   . Dehydration 01/27/2017  . Dermatitis 10/18/2013  . GERD (gastroesophageal reflux disease)   . Hyperlipidemia   . Other and unspecified hyperlipidemia 10/18/2013  . Perimenopause 04/12/2014   Past Surgical History:  Procedure Laterality Date  . NO PAST SURGERIES     Social History   Socioeconomic History  .  Marital status: Married    Spouse name: Not on file  . Number of children: 3  . Years of education: Not on file  . Highest education level: Not on file  Occupational History  . Occupation: RN- [employer]  Tobacco Use  . Smoking status: Never Smoker  . Smokeless tobacco: Never Used   Vaping Use  . Vaping Use: Never used  Substance and Sexual Activity  . Alcohol use: No  . Drug use: No  . 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 Not on file  Social History Narrative  . Not on file   Social Determinants of Health   Financial Resource Strain: Not on file  Food Insecurity: Not on file  Transportation Needs: Not on file  Physical Activity: Not on file  Stress: Not on file  Social Connections: Not on file  Intimate Partner Violence: Not on file   Family History  Problem Relation Age of Onset  . Hypertension Mother   . Hyperlipidemia Mother   . Cancer Mother 42       colon   . Colon cancer Mother   . COPD Father   . Breast cancer Neg Hx   . Esophageal cancer Neg Hx   . Pancreatic cancer Neg Hx   . Stomach cancer Neg Hx   . Inflammatory bowel disease Neg Hx   . Liver disease Neg Hx    I have reviewed her medical, social, and family history in detail and updated the electronic medical record as necessary.    PHYSICAL EXAMINATION  BP 132/82   Pulse 70   Ht 5' (1.524 m)   Wt 149 lb 6 oz (67.8 kg)   LMP 01/08/2016 Comment: Irregular per patient  BMI 29.17 kg/m  Wt Readings from Last 3 Encounters:  11/05/20 149 lb 6 oz (67.8 kg)  08/24/20 146 lb 12.8 oz (66.6 kg)  08/21/19 147 lb (66.7 kg)  GEN: NAD, appears stated age, doesn't appear chronically ill PSYCH: Cooperative, without pressured speech EYE: Conjunctivae pink, sclerae anicteric ENT: MMM, without oral ulcers, no erythema or exudates noted CV: RR without R/Gs  RESP: CTAB posteriorly, without wheezing GI: NABS, soft, NT/ND, without rebound or guarding, no HSM appreciated MSK/EXT: No lower extremity edema SKIN: No jaundice NEURO:  Alert & Oriented x 3, no focal deficits   REVIEW OF DATA  I reviewed the following data at the time of this encounter:  GI Procedures and Studies  No relevant studies to  review  Laboratory Studies  Reviewed those in epic  Imaging Studies  No relevant studies to review   ASSESSMENT  Ms. Strauss is a 49 y.o. female with a pmh significant for hypertension, hyperlipidemia, allergies, GERD, family history colon cancer (mother).  The patient is seen today for evaluation and management of:  1. Colon cancer screening   2. Family history of colon cancer in mother   45. Gastroesophageal reflux disease, unspecified whether esophagitis present    Patient is hemodynamically and clinically stable.  Based on the patient's mother having had colon cancer (even though she was over the age of 90) this patient is considered an intermediate to high risk individual.  We did discuss different methods of colon cancer screening including colonoscopy versus Cologuard testing versus FIT testing.  However as a result of her being an intermediate to high risk individual, colonoscopy is the most recommended strategy for colon  cancer screening in her perspective.  The risks and benefits of endoscopic evaluation were discussed with the patient; these include but are not limited to the risk of perforation, infection, bleeding, missed lesions, lack of diagnosis, severe illness requiring hospitalization, as well as anesthesia and sedation related illnesses.  The patient is agreeable to proceed.  We also discussed her infrequent GERD symptoms.  She is not having any other red flag symptoms as not having symptoms daily and not taking medication daily it is okay for [place] to hold on endoscopic evaluation from above but certainly if she develops symptoms more frequently or begins to use medication more frequently, we will need to consider an endoscopic evaluation.  All patient questions were answered to the best of my ability, and the patient agrees to the aforementioned plan of action with follow-up as indicated.   PLAN  Proceed with scheduling colonoscopy GERD lifestyle modifications -Eat meals >3  hours before bedtime -Do not lay down or lie flat immediately after eating for at least 2-hours -Do not overeat (decrease portion size and/or eat more frequent smaller meals) -Eat slowly -Wear Loose-fitting clothes -Raise Head of Bed (Head & Chest > Feet with bed blocks) -Avoid foods that trigger the symptoms (Onions, Chocolate, Caffeine, Spicy, and Fatty) -Work on Losing Weight -Avoid Alcohol  -Increase Exercise Activity -Maintain Heartburn Diary/Log If issues of GERD become more significant or happen more frequently will consider diagnostic endoscopy May continue using Tums as needed or if necessary initiate truly omeprazole daily   Orders Placed This Encounter  Procedures  . Ambulatory referral to Gastroenterology    New Prescriptions   NA SULFATE-K SULFATE-MG SULF (SUPREP BOWEL PREP KIT) 17.5-3.13-1.6 GM/177ML SOLN    Take 1 kit by mouth as directed. For colonoscopy prep   Modified Medications   No medications on file    Planned Follow Up No follow-ups on file.   Total Time in Face-to-Face and in Coordination of Care for patient including independent/personal interpretation/review of prior testing, medical history, examination, medication adjustment, communicating results with the patient directly, and documentation with the EHR is 35 minutes.   Justice Britain, MD [facility] Advanced Endoscopy Office # [phone]

## 2020-11-26 ENCOUNTER — Other Ambulatory Visit: Payer: Self-pay | Admitting: Family Medicine

## 2020-11-26 MED FILL — AMLODIPINE BESYLATE 10 MG T: 10 | 90 days supply | Qty: 90 | Fill #1

## 2020-11-26 MED FILL — OMEPRAZOLE 40 MG CPDR: 40 | 90 days supply | Qty: 90 | Fill #0

## 2020-12-22 ENCOUNTER — Encounter: Payer: Self-pay | Admitting: Gastroenterology

## 2020-12-25 ENCOUNTER — Other Ambulatory Visit: Payer: Self-pay

## 2020-12-25 ENCOUNTER — Encounter: Payer: Self-pay | Admitting: Gastroenterology

## 2020-12-25 ENCOUNTER — Ambulatory Visit (AMBULATORY_SURGERY_CENTER): Payer: 59 | Admitting: Gastroenterology

## 2020-12-25 VITALS — BP 110/69 | HR 65 | Temp 98.9°F | Resp 13 | Ht 60.0 in | Wt 149.0 lb

## 2020-12-25 DIAGNOSIS — E7800 Pure hypercholesterolemia, unspecified: Secondary | ICD-10-CM | POA: Diagnosis not present

## 2020-12-25 DIAGNOSIS — Z1211 Encounter for screening for malignant neoplasm of colon: Secondary | ICD-10-CM

## 2020-12-25 DIAGNOSIS — D127 Benign neoplasm of rectosigmoid junction: Secondary | ICD-10-CM | POA: Diagnosis not present

## 2020-12-25 DIAGNOSIS — K219 Gastro-esophageal reflux disease without esophagitis: Secondary | ICD-10-CM | POA: Diagnosis not present

## 2020-12-25 DIAGNOSIS — K635 Polyp of colon: Secondary | ICD-10-CM | POA: Diagnosis not present

## 2020-12-25 DIAGNOSIS — Z800 Family history of malignant neoplasm of digestive organs: Secondary | ICD-10-CM | POA: Diagnosis not present

## 2020-12-25 MED ORDER — SODIUM CHLORIDE 0.9 % IV SOLN: 500.0000 mL | INTRAVENOUS | Status: DC

## 2020-12-25 NOTE — Progress Notes (Signed)
Called to room to assist during endoscopic procedure.  Patient ID and intended procedure confirmed with present staff. Received instructions for my participation in the procedure from the performing physician.  

## 2020-12-25 NOTE — Patient Instructions (Signed)
Discharge instructions given. Handouts on polyps and Hemorrhoids. Contrast given in Recovery room for a non-urgent CT-Abdomin/Pelvis with IV/PO contrast to better define the cecum/appendiceal orifice region. Use fibercon 1-2 tablets po daily. Resume previous medications. YOU HAD AN ENDOSCOPIC PROCEDURE TODAY AT [facility]:   Refer to the procedure report that was given to you for any specific questions about what was found during the examination.  If the procedure report does not answer your questions, please call your gastroenterologist to clarify.  If you requested that your care partner not be given the details of your procedure findings, then the procedure report has been included in a sealed envelope for you to review at your convenience later.  YOU SHOULD EXPECT: Some feelings of bloating in the abdomen. Passage of more gas than usual.  Walking can help get rid of the air that was put into your GI tract during the procedure and reduce the bloating. If you had a lower endoscopy (such as a colonoscopy or flexible sigmoidoscopy) you may notice spotting of blood in your stool or on the toilet paper. If you underwent a bowel prep for your procedure, you may not have a normal bowel movement for a few days.  Please Note:  You might notice some irritation and congestion in your nose or some drainage.  This is from the oxygen used during your procedure.  There is no need for concern and it should clear up in a day or so.  SYMPTOMS TO REPORT IMMEDIATELY:   Following lower endoscopy (colonoscopy or flexible sigmoidoscopy):  Excessive amounts of blood in the stool  Significant tenderness or worsening of abdominal pains  Swelling of the abdomen that is new, acute  Fever of 100F or higher  For urgent or emergent issues, a gastroenterologist can be reached at any hour by calling [phone]. Do not use MyChart messaging for urgent concerns.    DIET:  We do recommend a small meal  at first, but then you may proceed to your regular diet.  Drink plenty of fluids but you should avoid alcoholic beverages for 24 hours.  ACTIVITY:  You should plan to take it easy for the rest of today and you should NOT DRIVE or use heavy machinery until tomorrow (because of the sedation medicines used during the test).    FOLLOW UP: Our staff will call the number listed on your records 48-72 hours following your procedure to check on you and address any questions or concerns that you may have regarding the information given to you following your procedure. If we do not reach you, we will leave a message.  We will attempt to reach you two times.  During this call, we will ask if you have developed any symptoms of COVID 19. If you develop any symptoms (ie: fever, flu-like symptoms, shortness of breath, cough etc.) before then, please call [phone].  If you test positive for Covid 19 in the 2 weeks post procedure, please call and report this information to [place].    If any biopsies were taken you will be contacted by phone or by letter within the next 1-3 weeks.  Please call us at [phone] if you have not heard about the biopsies in 3 weeks.    SIGNATURES/CONFIDENTIALITY: You and/or your care partner have signed paperwork which will be entered into your electronic medical record.  These signatures attest to the fact that that the information above on your After Visit Summary has been reviewed and is  understood.  Full responsibility of the confidentiality of this discharge information lies with you and/or your care-partner. 

## 2020-12-25 NOTE — Op Note (Signed)
[place] Patient Name: Tara Greene Procedure Date: 12/25/2020 1:05 PM MRN: [id number] Endoscopist: Justice Britain , MD Age: 50 Referring MD:  Date of Birth: [birth year] Gender: Female Account #: [id number] Procedure:                Colonoscopy Indications:              Screening patient at increased risk: Family history                            of 1st-degree relative with colorectal cancer at                            age 48 years (or older) Medicines:                Monitored Anesthesia Care Procedure:                Pre-Anesthesia Assessment:                           - Prior to the procedure, a History and Physical                            was performed, and patient medications and                            allergies were reviewed. The patient's tolerance of                            previous anesthesia was also reviewed. The risks                            and benefits of the procedure and the sedation                            options and risks were discussed with the patient.                            All questions were answered, and informed consent                            was obtained. Prior Anticoagulants: The patient has                            taken no previous anticoagulant or antiplatelet                            agents. ASA Grade Assessment: II - A patient with                            mild systemic disease. After reviewing the risks                            and benefits, the patient was deemed in  satisfactory condition to undergo the procedure.                           After obtaining informed consent, the colonoscope                            was passed under direct vision. Throughout the                            procedure, the patient's blood pressure, pulse, and                            oxygen saturations were monitored continuously. The                            Colonoscope was introduced  through the anus and                            advanced to the 5 cm into the ileum. The                            colonoscopy was performed without difficulty. The                            patient tolerated the procedure. The quality of the                            bowel preparation was good. The terminal ileum,                            ileocecal valve, appendiceal orifice, and rectum                            were photographed. Scope In: 1:20:39 PM Scope Out: 1:34:23 PM Scope Withdrawal Time: 0 hours 11 minutes 56 seconds  Total Procedure Duration: 0 hours 13 minutes 44 seconds  Findings:                 Skin tags were found on perianal exam.                           The digital rectal exam findings include                            hemorrhoids. Pertinent negatives include no                            palpable rectal lesions.                           The terminal ileum and ileocecal valve appeared                            normal.  One medium submucosal nodule vs extrinsic                            impression was found at the appendiceal orifice.                            Biopsies were taken with a cold forceps for                            histology via tunneled biopsies.                           A 5 mm polyp was found in the recto-sigmoid colon.                            The polyp was sessile. The polyp was removed with a                            cold snare. Resection and retrieval were complete.                           Normal mucosa was found in the entire colon                            otherwise.                           Non-bleeding non-thrombosed external and internal                            hemorrhoids were found during retroflexion, during                            perianal exam and during digital exam. The                            hemorrhoids were Grade II (internal hemorrhoids                            that prolapse  but reduce spontaneously). Complications:            No immediate complications. Estimated Blood Loss:     Estimated blood loss was minimal. Impression:               - Perianal skin tags found on perianal exam.                            Hemorrhoids found on digital rectal exam.                           - The examined portion of the ileum was normal.                           - Submucosal nodule vs extrinsic impression noted  at the appendiceal orifice. Tunnel biopsied.                           - One 5 mm polyp at the recto-sigmoid colon,                            removed with a cold snare. Resected and retrieved.                           - Normal mucosa in the entire examined colon                            otherwise.                           - Non-bleeding non-thrombosed external and internal                            hemorrhoids. Recommendation:           - The patient will be observed post-procedure,                            until all discharge criteria are met.                           - Discharge patient to home.                           - Patient has a contact number available for                            emergencies. The signs and symptoms of potential                            delayed complications were discussed with the                            patient. Return to normal activities tomorrow.                            Written discharge instructions were provided to the                            patient.                           - Use FiberCon 1-2 tablets PO daily.                           - Await pathology results.                           - Recommend a non-urgent CT-Abdomen/Pelvis with                            IV/PO contrast  to better define the                            cecum/appendiceal orifice region.                           - Repeat colonoscopy in 5 years for                            surveillance/screening no matter  pathology due to                            family history of colon cancer in mother.                           - However, depending on the findings of CT-AP will                            consider potential earlier follow up +/- Endoscopic                            Ultrasound evaluation of region                           - The findings and recommendations were discussed                            with the patient.                           - The findings and recommendations were discussed                            with the patient's family. Justice Britain, MD 12/25/2020 1:45:02 PM

## 2020-12-29 ENCOUNTER — Telehealth: Payer: Self-pay | Admitting: *Deleted

## 2020-12-29 ENCOUNTER — Telehealth: Payer: Self-pay

## 2020-12-29 NOTE — Telephone Encounter (Signed)
Follow up call made. 

## 2020-12-29 NOTE — Telephone Encounter (Signed)
Second post procedure follow up call, no answer 

## 2020-12-30 ENCOUNTER — Telehealth: Payer: Self-pay

## 2020-12-30 DIAGNOSIS — K639 Disease of intestine, unspecified: Secondary | ICD-10-CM

## 2020-12-30 NOTE — Telephone Encounter (Signed)
Left message on machine to call back  

## 2020-12-30 NOTE — Telephone Encounter (Signed)
Per the procedure report dated 1/28;  "Recommend a non-urgent CT-Abdomen/Pelvis with IV/PO contrast to better define the cecum/appendiceal orifice region"

## 2020-12-31 NOTE — Telephone Encounter (Signed)
CT has been scheduled at [facility] on 01/08/21 at 830 am to arrive at 815 am. Pt will need to pick up contrast and instructions prior to the appt.  Left message on machine to call back

## 2021-01-01 NOTE — Telephone Encounter (Signed)
The pt has been advised of the CT scan appt. She has the contrast and instructions on hand.  She will call with any questions.

## 2021-01-01 NOTE — Telephone Encounter (Signed)
Left message on machine to call back  

## 2021-01-06 ENCOUNTER — Encounter: Payer: Self-pay | Admitting: Gastroenterology

## 2021-01-08 ENCOUNTER — Ambulatory Visit (HOSPITAL_COMMUNITY)
Admission: RE | Admit: 2021-01-08 | Discharge: 2021-01-08 | Disposition: A | Discharge status: Home / Self Care | Payer: 59 | Source: Ambulatory Visit | Attending: Gastroenterology | Admitting: Gastroenterology

## 2021-01-08 ENCOUNTER — Other Ambulatory Visit: Payer: Self-pay

## 2021-01-08 ENCOUNTER — Encounter (HOSPITAL_COMMUNITY): Payer: Self-pay

## 2021-01-08 DIAGNOSIS — K760 Fatty (change of) liver, not elsewhere classified: Secondary | ICD-10-CM | POA: Diagnosis not present

## 2021-01-08 DIAGNOSIS — K639 Disease of intestine, unspecified: Secondary | ICD-10-CM | POA: Diagnosis not present

## 2021-01-08 LAB — POCT I-STAT CREATININE: Creatinine, Ser: 0.8 mg/dL (ref 0.44–1.00)

## 2021-01-08 MED ORDER — IOHEXOL 300 MG/ML  SOLN
100.0000 mL | Freq: Once | INTRAMUSCULAR | Status: AC | PRN
  Administered 2021-01-08: 75 mL via INTRAVENOUS

## 2021-01-27 ENCOUNTER — Other Ambulatory Visit: Payer: Self-pay

## 2021-01-27 NOTE — Progress Notes (Signed)
The proposed treatment discussed in conference is for discussion purposes only and is not a binding recommendation.  The patients have not been physically examined, or presented with their treatment options.  Therefore, final treatment plans cannot be decided.   

## 2021-01-30 ENCOUNTER — Encounter: Payer: Self-pay | Admitting: Gastroenterology

## 2021-01-30 NOTE — Progress Notes (Signed)
Case discussed at [facility] this week. Reviewed with radiology and discussed case with surgeons. All agree to move forward with surgical evaluation and consideration of an extended appendectomy /Cecectomy. We have placed a referral to Dr. Marcello Moores previously it was not clear that she had been scheduled as of yet. We will work on ensuring that she is up-to-date with the final recommendations for surgical evaluation and ensure that her referral and appointments are made.  Justice Britain, MD [facility] Advanced Endoscopy Office # [phone]

## 2021-02-01 NOTE — Progress Notes (Signed)
Left message on machine to call back  

## 2021-02-02 NOTE — Progress Notes (Signed)
The pt has been advised and her appt is Monday 3/14 at [facility]

## 2021-02-02 NOTE — Progress Notes (Signed)
Thanks. GM 

## 2021-02-08 ENCOUNTER — Ambulatory Visit: Payer: Self-pay | Admitting: General Surgery

## 2021-02-08 DIAGNOSIS — K6389 Other specified diseases of intestine: Secondary | ICD-10-CM | POA: Diagnosis not present

## 2021-02-08 NOTE — H&P (Signed)
The patient is a 50 year old female who presents with a colonic mass.50 year old female who presents to the office for evaluation of a colon lesion seen in the wall of the cecum on recent colonoscopy. Tunnel biopsies did not show any definitive pathology. CT scan personally reviewed and shows the 1.8 x 1.5 cm mass in the cecum near the appendiceal orifice, and will away from the ileocecal valve. Patient has a family history of colon cancer. Her mother died of this in her 32s.   Past Surgical History Mammie Lorenzo, LPN; 2/77/8242 35:36 AM) No pertinent past surgical history  Diagnostic Studies History Mammie Lorenzo, LPN; 1/44/3154 00:86 AM) Colonoscopy within last year Mammogram within last year Pap Smear 1-5 years ago  Allergies Mammie Lorenzo, LPN; 7/61/9509 32:67 AM) No Known Drug Allergies [02/08/2021]: Allergies Reconciled  Medication History Mammie Lorenzo, LPN; 12/22/5807 98:33 AM) amLODIPine Besylate (10MG  Tablet, Oral) Active. Omeprazole (40MG  Capsule DR, Oral) Active. Vitamin D (1000UNIT Tablet, Oral) Active. Medications Reconciled  Social History Mammie Lorenzo, LPN; 07/22/538 76:73 AM) Alcohol use Occasional alcohol use. Caffeine use Carbonated beverages. No drug use Tobacco use Never smoker.  Family History Mammie Lorenzo, LPN; 03/16/3789 24:09 AM) Colon Cancer Mother. Hypertension Mother. Respiratory Condition Father.  Pregnancy / Birth History Mammie Lorenzo, LPN; 7/35/3299 24:26 AM) Age at menarche 31 years. Age of menopause 61-50 Gravida 3 Length (months) of breastfeeding 7-12 Para 3 Regular periods  Other Problems Mammie Lorenzo, LPN; 8/34/1962 22:97 AM) Gastroesophageal Reflux Disease High blood pressure     Review of Systems Claiborne Billings Dockery LPN; 9/89/2119 41:74 AM) General Not Present- Appetite Loss, Chills, Fatigue, Fever, Night Sweats, Weight Gain and Weight Loss. Skin Not Present- Change in Wart/Mole, Dryness, Hives,  Jaundice, New Lesions, Non-Healing Wounds, Rash and Ulcer. HEENT Present- Seasonal Allergies. Not Present- Earache, Hearing Loss, Hoarseness, Nose Bleed, Oral Ulcers, Ringing in the Ears, Sinus Pain, Sore Throat, Visual Disturbances, Wears glasses/contact lenses and Yellow Eyes. Respiratory Not Present- Bloody sputum, Chronic Cough, Difficulty Breathing, Snoring and Wheezing. Breast Not Present- Breast Mass, Breast Pain, Nipple Discharge and Skin Changes. Cardiovascular Not Present- Chest Pain, Difficulty Breathing Lying Down, Leg Cramps, Palpitations, Rapid Heart Rate, Shortness of Breath and Swelling of Extremities. Gastrointestinal Present- Hemorrhoids. Not Present- Abdominal Pain, Bloating, Bloody Stool, Change in Bowel Habits, Chronic diarrhea, Constipation, Difficulty Swallowing, Excessive gas, Gets full quickly at meals, Indigestion, Nausea, Rectal Pain and Vomiting. Female Genitourinary Not Present- Frequency, Nocturia, Painful Urination, Pelvic Pain and Urgency. Musculoskeletal Present- Joint Pain. Not Present- Back Pain, Joint Stiffness, Muscle Pain, Muscle Weakness and Swelling of Extremities. Neurological Not Present- Decreased Memory, Fainting, Headaches, Numbness, Seizures, Tingling, Tremor, Trouble walking and Weakness. Psychiatric Not Present- Anxiety, Bipolar, Change in Sleep Pattern, Depression, Fearful and Frequent crying. Endocrine Not Present- Cold Intolerance, Excessive Hunger, Hair Changes, Heat Intolerance, Hot flashes and New Diabetes. Hematology Not Present- Blood Thinners, Easy Bruising, Excessive bleeding, Gland problems, HIV and Persistent Infections.  Vitals Claiborne Billings Dockery LPN; 0/81/4481 85:63 AM) 02/08/2021 11:41 AM Weight: 146 lb Height: 60in Body Surface Area: 1.63 m Body Mass Index: 28.51 kg/m  Pulse: 76 (Regular)  BP: 122/70(Sitting, Left Arm, Standard)    Physical Exam Leighton Ruff MD; 1/49/7026 11:50 AM)  General Mental  Status-Alert. General Appearance-Cooperative. CV: RRR Lungs: CTA Abdomen Palpation/Percussion Palpation and Percussion of the abdomen reveal - Soft and Non Tender.    Assessment & Plan Leighton Ruff MD; 3/78/5885 11:51 AM)  MASS OF COLON (O27.74) Impression: 50 year old female who underwent a screening colonoscopy and was noted  to have a mass in the wall of her cecum near her appendiceal orifice. This is confirmed on CT scan. It is approximately 1 cm in diameter. We discussed performing a laparoscopic appendectomy with resection of the cecal mass. We discussed the typical risks of the surgery including bleeding, hernia, infection and leak at the staple line. All questions were answered

## 2021-03-02 NOTE — Patient Instructions (Addendum)
DUE TO COVID-19 ONLY ONE VISITOR IS ALLOWED TO COME WITH YOU AND STAY IN THE WAITING ROOM ONLY DURING PRE OP AND PROCEDURE DAY OF SURGERY. THE 1 VISITOR  MAY VISIT WITH YOU AFTER SURGERY IN YOUR PRIVATE ROOM DURING VISITING HOURS ONLY!  YOU NEED TO HAVE A COVID 19 TEST ON: 03/08/21 @ 8:25 AM , THIS TEST MUST BE DONE BEFORE SURGERY,  COVID TESTING SITE [place] [address], IT IS ON THE RIGHT GOING OUT [address] APPROXIMATELY  2 MINUTES PAST [employer] ON THE RIGHT. ONCE YOUR COVID TEST IS COMPLETED,  PLEASE BEGIN THE QUARANTINE INSTRUCTIONS AS OUTLINED IN YOUR HANDOUT.                [facility]    Your procedure is scheduled on: 03/11/21   Report to [facility] Main  Entrance   Report to admitting at: 9:30 AM     Call this number if you have problems the morning of surgery [phone]    Remember: Do not eat solid food :After Midnight. Clear liquids until: 8:30 am.  CLEAR LIQUID DIET  Foods Allowed                                                                     Foods Excluded  Coffee and tea, regular and decaf                             liquids that you cannot  Plain Jell-O any favor except red or purple                                           see through such as: Fruit ices (not with fruit pulp)                                     milk, soups, orange juice  Iced Popsicles                                    All solid food Carbonated beverages, regular and diet                                    Cranberry, grape and apple juices Sports drinks like Gatorade Lightly seasoned clear broth or consume(fat free) Sugar, honey syrup  Sample Menu Breakfast                                Lunch                                     Supper Cranberry juice  Beef broth                            Chicken broth Jell-O                                     Grape juice                           Apple juice Coffee or tea                         Jell-O                                      Popsicle                                                Coffee or tea                        Coffee or tea  _____________________________________________________________________  BRUSH YOUR TEETH MORNING OF SURGERY AND RINSE YOUR MOUTH OUT, NO CHEWING GUM CANDY OR MINTS.   Take these medicines the morning of surgery with A SIP OF WATER: amlodipine,omeprazole.Allegra as needed.Flonase as usual.                               You may not have any metal on your body including hair pins and              piercings  Do not wear jewelry, make-up, lotions, powders or perfumes, deodorant             Do not wear nail polish on your fingernails.  Do not shave  48 hours prior to surgery.    Do not bring valuables to the hospital. [place].  Contacts, dentures or bridgework may not be worn into surgery.  Leave suitcase in the car. After surgery it may be brought to your room.     Patients discharged the day of surgery will not be allowed to drive home. IF YOU ARE HAVING SURGERY AND GOING HOME THE SAME DAY, YOU MUST HAVE AN ADULT TO DRIVE YOU HOME AND BE WITH YOU FOR 24 HOURS. YOU MAY GO HOME BY TAXI OR UBER OR ORTHERWISE, BUT AN ADULT MUST ACCOMPANY YOU HOME AND STAY WITH YOU FOR 24 HOURS.  Name and phone number of your driver:  Special Instructions: N/A              Please read over the following fact sheets you were given: _____________________________________________________________________          [facility] - Preparing for Surgery Before surgery, you can play an important role.  Because skin is not sterile, your skin needs to be as free of germs as possible.  You can reduce the number of germs on your skin by washing with CHG (chlorahexidine gluconate) soap before surgery.  CHG  is an antiseptic cleaner which kills germs and bonds with the skin to continue killing germs even after  washing. Please DO NOT use if you have an allergy to CHG or antibacterial soaps.  If your skin becomes reddened/irritated stop using the CHG and inform your nurse when you arrive at Short Stay. Do not shave (including legs and underarms) for at least 48 hours prior to the first CHG shower.  You may shave your face/neck. Please follow these instructions carefully:  1.  Shower with CHG Soap the night before surgery and the  morning of Surgery.  2.  If you choose to wash your hair, wash your hair first as usual with your  normal  shampoo.  3.  After you shampoo, rinse your hair and body thoroughly to remove the  shampoo.                           4.  Use CHG as you would any other liquid soap.  You can apply chg directly  to the skin and wash                       Gently with a scrungie or clean washcloth.  5.  Apply the CHG Soap to your body ONLY FROM THE NECK DOWN.   Do not use on face/ open                           Wound or open sores. Avoid contact with eyes, ears mouth and genitals (private parts).                       Wash face,  Genitals (private parts) with your normal soap.             6.  Wash thoroughly, paying special attention to the area where your surgery  will be performed.  7.  Thoroughly rinse your body with warm water from the neck down.  8.  DO NOT shower/wash with your normal soap after using and rinsing off  the CHG Soap.                9.  Pat yourself dry with a clean towel.            10.  Wear clean pajamas.            11.  Place clean sheets on your bed the night of your first shower and do not  sleep with pets. Day of Surgery : Do not apply any lotions/deodorants the morning of surgery.  Please wear clean clothes to the hospital/surgery center.  FAILURE TO FOLLOW THESE INSTRUCTIONS MAY RESULT IN THE CANCELLATION OF YOUR SURGERY PATIENT SIGNATURE_________________________________  NURSE  SIGNATURE__________________________________  ________________________________________________________________________

## 2021-03-03 ENCOUNTER — Encounter (HOSPITAL_COMMUNITY)
Admission: RE | Admit: 2021-03-03 | Discharge: 2021-03-03 | Disposition: A | Discharge status: Home / Self Care | Payer: 59 | Source: Ambulatory Visit | Attending: General Surgery | Admitting: General Surgery

## 2021-03-03 ENCOUNTER — Other Ambulatory Visit: Payer: Self-pay

## 2021-03-03 ENCOUNTER — Encounter (HOSPITAL_COMMUNITY): Payer: Self-pay

## 2021-03-03 DIAGNOSIS — Z01818 Encounter for other preprocedural examination: Secondary | ICD-10-CM | POA: Insufficient documentation

## 2021-03-03 LAB — CBC
HCT: 42.3 % (ref 36.0–46.0)
Hemoglobin: 13.6 g/dL (ref 12.0–15.0)
MCH: 28.1 pg (ref 26.0–34.0)
MCHC: 32.2 g/dL (ref 30.0–36.0)
MCV: 87.4 fL (ref 80.0–100.0)
Platelets: 347 10*3/uL (ref 150–400)
RBC: 4.84 MIL/uL (ref 3.87–5.11)
RDW: 12.6 % (ref 11.5–15.5)
WBC: 5.2 10*3/uL (ref 4.0–10.5)
nRBC: 0 % (ref 0.0–0.2)

## 2021-03-03 LAB — BASIC METABOLIC PANEL
Anion gap: 8 (ref 5–15)
BUN: 13 mg/dL (ref 6–20)
CO2: 26 mmol/L (ref 22–32)
Calcium: 9.4 mg/dL (ref 8.9–10.3)
Chloride: 106 mmol/L (ref 98–111)
Creatinine, Ser: 0.73 mg/dL (ref 0.44–1.00)
GFR, Estimated: >60 mL/min (ref >60)
Glucose, Bld: 104 mg/dL — ABNORMAL HIGH (ref 70–99)
Potassium: 4.2 mmol/L (ref 3.5–5.1)
Sodium: 140 mmol/L (ref 135–145)

## 2021-03-03 NOTE — Progress Notes (Signed)
COVID Vaccine Completed: Yes Date COVID Vaccine completed:09/11/20  COVID vaccine manufacturer: Pfizer     PCP - Dr. Penni Homans Cardiologist -   Chest x-ray -  EKG -  Stress Test -  ECHO -  Cardiac Cath -  Pacemaker/ICD device last checked:  Sleep Study -  CPAP -   Fasting Blood Sugar -  Checks Blood Sugar _____ times a day  Blood Thinner Instructions: Aspirin Instructions: Last Dose:  Anesthesia review:   Patient denies shortness of breath, fever, cough and chest pain at PAT appointment   Patient verbalized understanding of instructions that were given to them at the PAT appointment. Patient was also instructed that they will need to review over the PAT instructions again at home before surgery.

## 2021-03-08 ENCOUNTER — Other Ambulatory Visit (HOSPITAL_COMMUNITY)
Admission: RE | Admit: 2021-03-08 | Discharge: 2021-03-08 | Disposition: A | Discharge status: Home / Self Care | Payer: 59 | Source: Ambulatory Visit | Attending: General Surgery | Admitting: General Surgery

## 2021-03-08 DIAGNOSIS — Z01812 Encounter for preprocedural laboratory examination: Secondary | ICD-10-CM | POA: Diagnosis not present

## 2021-03-08 DIAGNOSIS — Z20822 Contact with and (suspected) exposure to covid-19: Secondary | ICD-10-CM | POA: Diagnosis not present

## 2021-03-08 LAB — SARS CORONAVIRUS 2 (TAT 6-24 HRS): SARS Coronavirus 2: NEGATIVE

## 2021-03-09 ENCOUNTER — Other Ambulatory Visit (HOSPITAL_BASED_OUTPATIENT_CLINIC_OR_DEPARTMENT_OTHER): Payer: Self-pay

## 2021-03-09 ENCOUNTER — Other Ambulatory Visit: Payer: Self-pay | Admitting: Family Medicine

## 2021-03-09 MED ORDER — AMLODIPINE BESYLATE 10 MG PO TABS
ORAL_TABLET | Freq: Every day | ORAL | 1 refills | Status: DC
  Filled 2021-03-09: qty 90, 90d supply, fill #0
  Filled 2021-06-18: qty 90, 90d supply, fill #1

## 2021-03-11 ENCOUNTER — Encounter (HOSPITAL_COMMUNITY)
Admission: RE | Disposition: A | Discharge status: Home / Self Care | Payer: Self-pay | Source: Ambulatory Visit | Attending: General Surgery

## 2021-03-11 ENCOUNTER — Ambulatory Visit (HOSPITAL_COMMUNITY): Payer: 59 | Admitting: Certified Registered Nurse Anesthetist

## 2021-03-11 ENCOUNTER — Encounter (HOSPITAL_COMMUNITY): Payer: Self-pay | Admitting: General Surgery

## 2021-03-11 ENCOUNTER — Other Ambulatory Visit (HOSPITAL_BASED_OUTPATIENT_CLINIC_OR_DEPARTMENT_OTHER): Payer: Self-pay

## 2021-03-11 ENCOUNTER — Ambulatory Visit (HOSPITAL_COMMUNITY)
Admission: RE | Admit: 2021-03-11 | Discharge: 2021-03-11 | Disposition: A | Discharge status: Home / Self Care | Payer: 59 | Source: Ambulatory Visit | Attending: General Surgery | Admitting: General Surgery

## 2021-03-11 DIAGNOSIS — K6389 Other specified diseases of intestine: Secondary | ICD-10-CM | POA: Insufficient documentation

## 2021-03-11 DIAGNOSIS — Z538 Procedure and treatment not carried out for other reasons: Secondary | ICD-10-CM | POA: Diagnosis not present

## 2021-03-11 DIAGNOSIS — D649 Anemia, unspecified: Secondary | ICD-10-CM | POA: Diagnosis not present

## 2021-03-11 DIAGNOSIS — I10 Essential (primary) hypertension: Secondary | ICD-10-CM | POA: Diagnosis not present

## 2021-03-11 DIAGNOSIS — K219 Gastro-esophageal reflux disease without esophagitis: Secondary | ICD-10-CM | POA: Diagnosis not present

## 2021-03-11 HISTORY — PX: LAPAROSCOPIC APPENDECTOMY: SHX408

## 2021-03-11 SURGERY — APPENDECTOMY, LAPAROSCOPIC
Anesthesia: General

## 2021-03-11 MED ORDER — ACETAMINOPHEN 500 MG PO TABS
1000.0000 mg | ORAL_TABLET | ORAL | Status: AC
  Administered 2021-03-11: 1000 mg via ORAL
  Filled 2021-03-11: qty 2

## 2021-03-11 MED ORDER — ACETAMINOPHEN 650 MG RE SUPP
650.0000 mg | RECTAL | Status: DC | PRN
  Filled 2021-03-11: qty 1

## 2021-03-11 MED ORDER — OXYCODONE HCL 5 MG/5ML PO SOLN: 5.0000 mg | Freq: Once | ORAL | Status: DC | PRN

## 2021-03-11 MED ORDER — SODIUM CHLORIDE 0.9 % IV SOLN
2.0000 g | INTRAVENOUS | Status: DC
  Filled 2021-03-11: qty 2

## 2021-03-11 MED ORDER — DEXAMETHASONE SODIUM PHOSPHATE 10 MG/ML IJ SOLN
INTRAMUSCULAR | Status: AC
  Filled 2021-03-11: qty 1

## 2021-03-11 MED ORDER — ACETAMINOPHEN 325 MG PO TABS: 325.0000 mg | ORAL_TABLET | ORAL | Status: DC | PRN

## 2021-03-11 MED ORDER — OXYMETAZOLINE HCL 0.05 % NA SOLN
NASAL | Status: AC
  Filled 2021-03-11: qty 30

## 2021-03-11 MED ORDER — OXYCODONE HCL 5 MG PO TABS: 5.0000 mg | ORAL_TABLET | Freq: Once | ORAL | Status: DC | PRN

## 2021-03-11 MED ORDER — ONDANSETRON HCL 4 MG/2ML IJ SOLN
INTRAMUSCULAR | Status: AC
  Filled 2021-03-11: qty 2

## 2021-03-11 MED ORDER — ACETAMINOPHEN 325 MG PO TABS: 650.0000 mg | ORAL_TABLET | ORAL | Status: DC | PRN

## 2021-03-11 MED ORDER — ROCURONIUM BROMIDE 10 MG/ML (PF) SYRINGE
PREFILLED_SYRINGE | INTRAVENOUS | Status: DC | PRN
  Administered 2021-03-11: 70 mg via INTRAVENOUS

## 2021-03-11 MED ORDER — LIDOCAINE 2% (20 MG/ML) 5 ML SYRINGE
INTRAMUSCULAR | Status: DC | PRN
  Administered 2021-03-11: 100 mg via INTRAVENOUS

## 2021-03-11 MED ORDER — BUPIVACAINE-EPINEPHRINE (PF) 0.5% -1:200000 IJ SOLN
INTRAMUSCULAR | Status: AC
  Filled 2021-03-11: qty 30

## 2021-03-11 MED ORDER — SODIUM CHLORIDE 0.9% FLUSH: 3.0000 mL | INTRAVENOUS | Status: DC | PRN

## 2021-03-11 MED ORDER — ACETAMINOPHEN 160 MG/5ML PO SOLN
325.0000 mg | ORAL | Status: DC | PRN
Start: 2021-03-11 — End: 2021-03-11

## 2021-03-11 MED ORDER — LACTATED RINGERS IV SOLN: INTRAVENOUS | Status: DC

## 2021-03-11 MED ORDER — FENTANYL CITRATE (PF) 100 MCG/2ML IJ SOLN: 25.0000 ug | INTRAMUSCULAR | Status: DC | PRN

## 2021-03-11 MED ORDER — DEXAMETHASONE SODIUM PHOSPHATE 10 MG/ML IJ SOLN
INTRAMUSCULAR | Status: DC | PRN
  Administered 2021-03-11: 10 mg via INTRAVENOUS

## 2021-03-11 MED ORDER — ORAL CARE MOUTH RINSE
15.0000 mL | Freq: Once | OROMUCOSAL | Status: AC
  Administered 2021-03-11: 15 mL via OROMUCOSAL

## 2021-03-11 MED ORDER — MIDAZOLAM HCL 5 MG/5ML IJ SOLN
INTRAMUSCULAR | Status: DC | PRN
  Administered 2021-03-11: 2 mg via INTRAVENOUS

## 2021-03-11 MED ORDER — SODIUM CHLORIDE 0.9% FLUSH: 3.0000 mL | Freq: Two times a day (BID) | INTRAVENOUS | Status: DC

## 2021-03-11 MED ORDER — LIDOCAINE 2% (20 MG/ML) 5 ML SYRINGE
INTRAMUSCULAR | Status: AC
  Filled 2021-03-11: qty 5

## 2021-03-11 MED ORDER — CHLORHEXIDINE GLUCONATE 0.12 % MT SOLN: 15.0000 mL | Freq: Once | OROMUCOSAL | Status: AC

## 2021-03-11 MED ORDER — LIDOCAINE VISCOUS HCL 2 % MT SOLN
15.0000 mL | OROMUCOSAL | Status: DC | PRN
  Filled 2021-03-11: qty 15

## 2021-03-11 MED ORDER — FENTANYL CITRATE (PF) 100 MCG/2ML IJ SOLN
INTRAMUSCULAR | Status: DC | PRN
  Administered 2021-03-11 (×2): 50 ug via INTRAVENOUS

## 2021-03-11 MED ORDER — SUGAMMADEX SODIUM 500 MG/5ML IV SOLN
INTRAVENOUS | Status: DC | PRN
  Administered 2021-03-11: 500 mg via INTRAVENOUS

## 2021-03-11 MED ORDER — ROCURONIUM BROMIDE 10 MG/ML (PF) SYRINGE
PREFILLED_SYRINGE | INTRAVENOUS | Status: AC
  Filled 2021-03-11: qty 10

## 2021-03-11 MED ORDER — FENTANYL CITRATE (PF) 100 MCG/2ML IJ SOLN
INTRAMUSCULAR | Status: AC
  Filled 2021-03-11: qty 2

## 2021-03-11 MED ORDER — ONDANSETRON HCL 4 MG/2ML IJ SOLN
INTRAMUSCULAR | Status: DC | PRN
  Administered 2021-03-11: 4 mg via INTRAVENOUS

## 2021-03-11 MED ORDER — ONDANSETRON HCL 4 MG/2ML IJ SOLN: 4.0000 mg | Freq: Once | INTRAMUSCULAR | Status: DC | PRN

## 2021-03-11 MED ORDER — PROPOFOL 10 MG/ML IV BOLUS
INTRAVENOUS | Status: DC | PRN
  Administered 2021-03-11: 150 mg via INTRAVENOUS

## 2021-03-11 MED ORDER — KETOROLAC TROMETHAMINE 30 MG/ML IJ SOLN: 30.0000 mg | Freq: Once | INTRAMUSCULAR | Status: DC | PRN

## 2021-03-11 MED ORDER — SODIUM CHLORIDE 0.9 % IV SOLN: 250.0000 mL | INTRAVENOUS | Status: DC | PRN

## 2021-03-11 MED ORDER — MIDAZOLAM HCL 2 MG/2ML IJ SOLN
INTRAMUSCULAR | Status: AC
  Filled 2021-03-11: qty 2

## 2021-03-11 MED ORDER — MEPERIDINE HCL 50 MG/ML IJ SOLN: 6.2500 mg | INTRAMUSCULAR | Status: DC | PRN

## 2021-03-11 SURGICAL SUPPLY — 39 items
APPLIER CLIP 5 13 M/L LIGAMAX5 (MISCELLANEOUS)
APPLIER CLIP ROT 10 11.4 M/L (STAPLE)
CABLE HIGH FREQUENCY MONO STRZ (ELECTRODE) ×2 IMPLANT
CHLORAPREP W/TINT 26 (MISCELLANEOUS) ×2 IMPLANT
CLIP APPLIE 5 13 M/L LIGAMAX5 (MISCELLANEOUS) IMPLANT
CLIP APPLIE ROT 10 11.4 M/L (STAPLE) IMPLANT
COVER WAND RF STERILE (DRAPES) IMPLANT
DECANTER SPIKE VIAL GLASS SM (MISCELLANEOUS) ×2 IMPLANT
DERMABOND ADVANCED (GAUZE/BANDAGES/DRESSINGS) ×1
DERMABOND ADVANCED .7 DNX12 (GAUZE/BANDAGES/DRESSINGS) ×1 IMPLANT
DRAPE LAPAROSCOPIC ABDOMINAL (DRAPES) IMPLANT
ELECT REM PT RETURN 15FT ADLT (MISCELLANEOUS) ×2 IMPLANT
GLOVE SURG ENC MOIS LTX SZ6.5 (GLOVE) ×2 IMPLANT
GLOVE SURG UNDER POLY LF SZ7 (GLOVE) ×2 IMPLANT
GOWN STRL REUS W/TWL XL LVL3 (GOWN DISPOSABLE) ×4 IMPLANT
GRASPER SUT TROCAR 14GX15 (MISCELLANEOUS) IMPLANT
HANDLE STAPLE EGIA 4 XL (STAPLE) IMPLANT
IRRIG SUCT STRYKERFLOW 2 WTIP (MISCELLANEOUS) ×2
IRRIGATION SUCT STRKRFLW 2 WTP (MISCELLANEOUS) ×1 IMPLANT
KIT BASIN OR (CUSTOM PROCEDURE TRAY) ×2 IMPLANT
KIT TURNOVER KIT A (KITS) ×2 IMPLANT
MARKER SKIN DUAL TIP RULER LAB (MISCELLANEOUS) IMPLANT
PENCIL SMOKE EVACUATOR (MISCELLANEOUS) IMPLANT
POUCH SPECIMEN RETRIEVAL 10MM (ENDOMECHANICALS) IMPLANT
RELOAD EGIA 45 MED/THCK PURPLE (STAPLE) IMPLANT
RELOAD EGIA 45 TAN VASC (STAPLE) IMPLANT
RELOAD EGIA 60 MED/THCK PURPLE (STAPLE) IMPLANT
RELOAD EGIA 60 TAN VASC (STAPLE) IMPLANT
SCISSORS LAP 5X35 DISP (ENDOMECHANICALS) IMPLANT
SLEEVE XCEL OPT CAN 5 100 (ENDOMECHANICALS) IMPLANT
SUT VIC AB 2-0 SH 27 (SUTURE)
SUT VIC AB 2-0 SH 27X BRD (SUTURE) IMPLANT
SUT VIC AB 4-0 PS2 27 (SUTURE) ×2 IMPLANT
SUT VICRYL 0 UR6 27IN ABS (SUTURE) IMPLANT
TOWEL OR 17X26 10 PK STRL BLUE (TOWEL DISPOSABLE) ×2 IMPLANT
TRAY FOLEY MTR SLVR 16FR STAT (SET/KITS/TRAYS/PACK) ×2 IMPLANT
TRAY LAPAROSCOPIC (CUSTOM PROCEDURE TRAY) ×2 IMPLANT
TROCAR BLADELESS OPT 5 100 (ENDOMECHANICALS) IMPLANT
TROCAR XCEL BLUNT TIP 100MML (ENDOMECHANICALS) ×2 IMPLANT

## 2021-03-11 NOTE — H&P (Signed)
The patient is a 50 year old female who presents with a colonic mass.50 year old female who presents to the office for evaluation of a colon lesion seen in the wall of the cecum on recent colonoscopy. Tunnel biopsies did not show any definitive pathology. CT scan personally reviewed and shows the 1.8 x 1.5 cm mass in the cecum near the appendiceal orifice, and will away from the ileocecal valve. Patient has a family history of colon cancer. Her mother died of this in her 104s.   Past Surgical History Mammie Lorenzo, LPN; 1/61/0960 45:40 AM) No pertinent past surgical history  Diagnostic Studies History Mammie Lorenzo, LPN; 9/81/1914 78:29 AM) Colonoscopy within last year Mammogram within last year Pap Smear 1-5 years ago  Allergies Mammie Lorenzo, LPN; 5/62/1308 65:78 AM) No Known Drug Allergies [02/08/2021]: Allergies Reconciled  Medication History Mammie Lorenzo, LPN; 4/69/6295 28:41 AM) amLODIPine Besylate (10MG  Tablet, Oral) Active. Omeprazole (40MG  Capsule DR, Oral) Active. Vitamin D (1000UNIT Tablet, Oral) Active. Medications Reconciled  Social History Mammie Lorenzo, LPN; 02/18/4009 27:25 AM) Alcohol use Occasional alcohol use. Caffeine use Carbonated beverages. No drug use Tobacco use Never smoker.  Family History Mammie Lorenzo, LPN; 3/66/4403 47:42 AM) Colon Cancer Mother. Hypertension Mother. Respiratory Condition Father.  Pregnancy / Birth History Mammie Lorenzo, LPN; 5/95/6387 56:43 AM) Age at menarche 72 years. Age of menopause 27-50 Gravida 3 Length (months) of breastfeeding 7-12 Para 3 Regular periods  Other Problems Mammie Lorenzo, LPN; 02/24/5187 41:66 AM) Gastroesophageal Reflux Disease High blood pressure     Review of Systems  General Not Present- Appetite Loss, Chills, Fatigue, Fever, Night Sweats, Weight Gain and Weight Loss. Skin Not Present- Change in Wart/Mole, Dryness, Hives, Jaundice, New Lesions,  Non-Healing Wounds, Rash and Ulcer. HEENT Present- Seasonal Allergies. Not Present- Earache, Hearing Loss, Hoarseness, Nose Bleed, Oral Ulcers, Ringing in the Ears, Sinus Pain, Sore Throat, Visual Disturbances, Wears glasses/contact lenses and Yellow Eyes. Respiratory Not Present- Bloody sputum, Chronic Cough, Difficulty Breathing, Snoring and Wheezing. Breast Not Present- Breast Mass, Breast Pain, Nipple Discharge and Skin Changes. Cardiovascular Not Present- Chest Pain, Difficulty Breathing Lying Down, Leg Cramps, Palpitations, Rapid Heart Rate, Shortness of Breath and Swelling of Extremities. Gastrointestinal Present- Hemorrhoids. Not Present- Abdominal Pain, Bloating, Bloody Stool, Change in Bowel Habits, Chronic diarrhea, Constipation, Difficulty Swallowing, Excessive gas, Gets full quickly at meals, Indigestion, Nausea, Rectal Pain and Vomiting. Female Genitourinary Not Present- Frequency, Nocturia, Painful Urination, Pelvic Pain and Urgency. Musculoskeletal Present- Joint Pain. Not Present- Back Pain, Joint Stiffness, Muscle Pain, Muscle Weakness and Swelling of Extremities. Neurological Not Present- Decreased Memory, Fainting, Headaches, Numbness, Seizures, Tingling, Tremor, Trouble walking and Weakness. Psychiatric Not Present- Anxiety, Bipolar, Change in Sleep Pattern, Depression, Fearful and Frequent crying. Endocrine Not Present- Cold Intolerance, Excessive Hunger, Hair Changes, Heat Intolerance, Hot flashes and New Diabetes. Hematology Not Present- Blood Thinners, Easy Bruising, Excessive bleeding, Gland problems, HIV and Persistent Infections.  BP 131/83   Pulse 80   Temp 99.2 F (37.3 C) (Oral)   Resp 16   LMP 01/08/2016 Comment: Irregular per patient  SpO2 98%     Physical Exam   General Mental Status-Alert. General Appearance-Cooperative. CV: RRR Lungs: CTA Abdomen Palpation/Percussion Palpation and Percussion of the abdomen reveal - Soft and Non  Tender.    Assessment & Plan   MASS OF COLON (A63.01) Impression: 50 year old female who underwent a screening colonoscopy and was noted to have a mass in the wall of her cecum near her appendiceal orifice. This is confirmed on CT scan.  It is approximately 1 cm in diameter. We discussed performing a laparoscopic appendectomy with resection of the cecal mass. We discussed the typical risks of the surgery including bleeding, hernia, infection and leak at the staple line. All questions were answered

## 2021-03-11 NOTE — Transfer of Care (Signed)
Immediate Anesthesia Transfer of Care Note  Patient: Tara Greene  Procedure(s) Performed: APPENDECTOMY LAPAROSCOPIC (N/A )  Patient Location: PACU  Anesthesia Type:General  Level of Consciousness: awake, drowsy and patient cooperative  Airway & Oxygen Therapy: Patient Spontanous Breathing and Patient connected to face mask oxygen  Post-op Assessment: Report given to RN and Post -op Vital signs reviewed and stable  Post vital signs: Reviewed and stable  Last Vitals:  Vitals Value Taken Time  BP 113/73 03/11/21 1115  Temp 36.5 C 03/11/21 1104  Pulse 63 03/11/21 1123  Resp 15 03/11/21 1123  SpO2 95 % 03/11/21 1123  Vitals shown include unvalidated device data.  Last Pain:  Vitals:   03/11/21 1104  TempSrc:   PainSc: 3          Complications: No complications documented.

## 2021-03-11 NOTE — Anesthesia Preprocedure Evaluation (Signed)
Anesthesia Evaluation  Patient identified by MRN, date of birth, ID band Patient awake    Reviewed: Allergy & Precautions, NPO status , Patient's Chart, lab work & pertinent test results  Airway Mallampati: II       Dental no notable dental hx.    Pulmonary neg pulmonary ROS,    Pulmonary exam normal        Cardiovascular hypertension, Pt. on medications Normal cardiovascular exam     Neuro/Psych negative neurological ROS  negative psych ROS   GI/Hepatic Neg liver ROS, GERD  Medicated and Controlled,  Endo/Other    Renal/GU negative Renal ROS  negative genitourinary   Musculoskeletal negative musculoskeletal ROS (+)   Abdominal (+) + obese,   Peds  Hematology   Anesthesia Other Findings   Reproductive/Obstetrics                             Anesthesia Physical Anesthesia Plan  ASA: II  Anesthesia Plan: General   Post-op Pain Management:    Induction: Intravenous  PONV Risk Score and Plan: 4 or greater and Ondansetron, Dexamethasone and Midazolam  Airway Management Planned: Oral ETT  Additional Equipment: None  Intra-op Plan:   Post-operative Plan: Extubation in OR  Informed Consent: I have reviewed the patients History and Physical, chart, labs and discussed the procedure including the risks, benefits and alternatives for the proposed anesthesia with the patient or authorized representative who has indicated his/her understanding and acceptance.     Dental advisory given  Plan Discussed with: CRNA  Anesthesia Plan Comments:         Anesthesia Quick Evaluation

## 2021-03-11 NOTE — H&P (View-Only) (Signed)
The patient is a 50 year old female who presents with a colonic mass.50 year old female who presents to the office for evaluation of a colon lesion seen in the wall of the cecum on recent colonoscopy. Tunnel biopsies did not show any definitive pathology. CT scan personally reviewed and shows the 1.8 x 1.5 cm mass in the cecum near the appendiceal orifice, and will away from the ileocecal valve. Patient has a family history of colon cancer. Her mother died of this in her 52s.   Past Surgical History Mammie Lorenzo, LPN; 8/65/7846 96:29 AM) No pertinent past surgical history  Diagnostic Studies History Mammie Lorenzo, LPN; 04/25/4131 44:01 AM) Colonoscopy within last year Mammogram within last year Pap Smear 1-5 years ago  Allergies Mammie Lorenzo, LPN; 0/27/2536 64:40 AM) No Known Drug Allergies [02/08/2021]: Allergies Reconciled  Medication History Mammie Lorenzo, LPN; 3/47/4259 56:38 AM) amLODIPine Besylate (10MG  Tablet, Oral) Active. Omeprazole (40MG  Capsule DR, Oral) Active. Vitamin D (1000UNIT Tablet, Oral) Active. Medications Reconciled  Social History Mammie Lorenzo, LPN; 7/56/4332 95:18 AM) Alcohol use Occasional alcohol use. Caffeine use Carbonated beverages. No drug use Tobacco use Never smoker.  Family History Mammie Lorenzo, LPN; 8/41/6606 30:16 AM) Colon Cancer Mother. Hypertension Mother. Respiratory Condition Father.  Pregnancy / Birth History Mammie Lorenzo, LPN; 0/08/9322 55:73 AM) Age at menarche 59 years. Age of menopause 53-50 Gravida 3 Length (months) of breastfeeding 7-12 Para 3 Regular periods  Other Problems Mammie Lorenzo, LPN; 01/17/2541 70:62 AM) Gastroesophageal Reflux Disease High blood pressure     Review of Systems  General Not Present- Appetite Loss, Chills, Fatigue, Fever, Night Sweats, Weight Gain and Weight Loss. Skin Not Present- Change in Wart/Mole, Dryness, Hives, Jaundice, New Lesions,  Non-Healing Wounds, Rash and Ulcer. HEENT Present- Seasonal Allergies. Not Present- Earache, Hearing Loss, Hoarseness, Nose Bleed, Oral Ulcers, Ringing in the Ears, Sinus Pain, Sore Throat, Visual Disturbances, Wears glasses/contact lenses and Yellow Eyes. Respiratory Not Present- Bloody sputum, Chronic Cough, Difficulty Breathing, Snoring and Wheezing. Breast Not Present- Breast Mass, Breast Pain, Nipple Discharge and Skin Changes. Cardiovascular Not Present- Chest Pain, Difficulty Breathing Lying Down, Leg Cramps, Palpitations, Rapid Heart Rate, Shortness of Breath and Swelling of Extremities. Gastrointestinal Present- Hemorrhoids. Not Present- Abdominal Pain, Bloating, Bloody Stool, Change in Bowel Habits, Chronic diarrhea, Constipation, Difficulty Swallowing, Excessive gas, Gets full quickly at meals, Indigestion, Nausea, Rectal Pain and Vomiting. Female Genitourinary Not Present- Frequency, Nocturia, Painful Urination, Pelvic Pain and Urgency. Musculoskeletal Present- Joint Pain. Not Present- Back Pain, Joint Stiffness, Muscle Pain, Muscle Weakness and Swelling of Extremities. Neurological Not Present- Decreased Memory, Fainting, Headaches, Numbness, Seizures, Tingling, Tremor, Trouble walking and Weakness. Psychiatric Not Present- Anxiety, Bipolar, Change in Sleep Pattern, Depression, Fearful and Frequent crying. Endocrine Not Present- Cold Intolerance, Excessive Hunger, Hair Changes, Heat Intolerance, Hot flashes and New Diabetes. Hematology Not Present- Blood Thinners, Easy Bruising, Excessive bleeding, Gland problems, HIV and Persistent Infections.  BP 131/83   Pulse 80   Temp 99.2 F (37.3 C) (Oral)   Resp 16   LMP 01/08/2016 Comment: Irregular per patient  SpO2 98%     Physical Exam   General Mental Status-Alert. General Appearance-Cooperative. CV: RRR Lungs: CTA Abdomen Palpation/Percussion Palpation and Percussion of the abdomen reveal - Soft and Non  Tender.    Assessment & Plan   MASS OF COLON (B76.28) Impression: 50 year old female who underwent a screening colonoscopy and was noted to have a mass in the wall of her cecum near her appendiceal orifice. This is confirmed on CT scan.  It is approximately 1 cm in diameter. We discussed performing a laparoscopic appendectomy with resection of the cecal mass. We discussed the typical risks of the surgery including bleeding, hernia, infection and leak at the staple line. All questions were answered

## 2021-03-11 NOTE — Addendum Note (Signed)
Addendum  created 03/11/21 1510 by West Pugh, CRNA   Charge Capture section accepted, Intraprocedure Event edited

## 2021-03-11 NOTE — OR Nursing (Signed)
Case cancelled per anesthesia. 

## 2021-03-11 NOTE — Anesthesia Procedure Notes (Deleted)
Performed by: West Pugh, CRNA

## 2021-03-11 NOTE — Anesthesia Postprocedure Evaluation (Signed)
Anesthesia Post Note  Patient: Tara Greene  Procedure(s) Performed: APPENDECTOMY LAPAROSCOPIC (N/A )     Patient location during evaluation: Phase II Anesthesia Type: General Level of consciousness: awake Pain management: pain level controlled Vital Signs Assessment: post-procedure vital signs reviewed and stable Respiratory status: spontaneous breathing Cardiovascular status: stable Postop Assessment: no apparent nausea or vomiting Anesthetic complications: yes (case cancelled secondary to difficult airway and airway trauma.) Comments: The patient was made aware of what went on in the OR. She has been instructed that she can have her surgery whenever she and Dr. Marcello Greene whom I also spoke to in the OR. The plan will be for a glidescope along with an ETT loaded flexible fiberoptic bronchoscope which should effect an intubation in the setting of an airway free from bleeding   No complications documented.  Last Vitals:  Vitals:   03/11/21 1145 03/11/21 1200  BP: 119/60   Pulse: (!) 59 (!) 59  Resp: 16 10  Temp:    SpO2: 94% 94%    Last Pain:  Vitals:   03/11/21 1145  TempSrc:   PainSc: [place]

## 2021-03-12 ENCOUNTER — Encounter (HOSPITAL_COMMUNITY): Payer: Self-pay | Admitting: General Surgery

## 2021-03-22 ENCOUNTER — Ambulatory Visit: Payer: Self-pay | Admitting: General Surgery

## 2021-03-22 NOTE — Progress Notes (Signed)
COVID Vaccine Completed: x3 Date COVID Vaccine completed:  11-27-19, 12-18-19 Has received booster: 09-11-20 COVID vaccine manufacturer: Pfizer     Date of COVID positive in last 90 days:  PCP - Penni Homans, MD Cardiologist -   Chest x-ray -  EKG - 03-03-21 Epic Stress Test -  ECHO -  Cardiac Cath -  Pacemaker/ICD device last checked: Spinal Cord Stimulator:  Sleep Study -  CPAP -   Fasting Blood Sugar -  Checks Blood Sugar _____ times a day  Blood Thinner Instructions: Aspirin Instructions: Last Dose:  Activity level:  Can go up a flight of stairs and perform activities of daily living without stopping and without symptoms of chest pain or shortness of breath.   Able to exercise without symptoms  Unable to go up a flight of stairs without symptoms of      Anesthesia review:  Difficult intubation on 03-11-21, case had to be canceled.  Patient denies shortness of breath, fever, cough and chest pain at PAT appointment   Patient verbalized understanding of instructions that were given to them at the PAT appointment. Patient was also instructed that they will need to review over the PAT instructions again at home before surgery.

## 2021-03-23 ENCOUNTER — Other Ambulatory Visit (HOSPITAL_COMMUNITY)
Admission: RE | Admit: 2021-03-23 | Discharge: 2021-03-23 | Disposition: A | Discharge status: Home / Self Care | Payer: 59 | Source: Ambulatory Visit | Attending: General Surgery | Admitting: General Surgery

## 2021-03-23 DIAGNOSIS — Z20822 Contact with and (suspected) exposure to covid-19: Secondary | ICD-10-CM | POA: Diagnosis not present

## 2021-03-23 DIAGNOSIS — Z01812 Encounter for preprocedural laboratory examination: Secondary | ICD-10-CM | POA: Insufficient documentation

## 2021-03-23 LAB — SARS CORONAVIRUS 2 (TAT 6-24 HRS): SARS Coronavirus 2: NEGATIVE

## 2021-03-24 NOTE — Progress Notes (Signed)
Spoke with Jeriah made aware to arrive at 10:15 AM 03/26/2021. Reviewed pre op instructions verbalized understanding.

## 2021-03-26 ENCOUNTER — Encounter (HOSPITAL_COMMUNITY)
Admission: RE | Disposition: A | Discharge status: Home / Self Care | Payer: Self-pay | Source: Home / Self Care | Attending: General Surgery

## 2021-03-26 ENCOUNTER — Ambulatory Visit (HOSPITAL_COMMUNITY): Payer: 59 | Admitting: Certified Registered"

## 2021-03-26 ENCOUNTER — Encounter (HOSPITAL_COMMUNITY): Payer: Self-pay | Admitting: General Surgery

## 2021-03-26 ENCOUNTER — Ambulatory Visit (HOSPITAL_COMMUNITY)
Admission: RE | Admit: 2021-03-26 | Discharge: 2021-03-26 | Disposition: A | Discharge status: Home / Self Care | Payer: 59 | Attending: General Surgery | Admitting: General Surgery

## 2021-03-26 DIAGNOSIS — Z800 Family history of malignant neoplasm of digestive organs: Secondary | ICD-10-CM | POA: Insufficient documentation

## 2021-03-26 DIAGNOSIS — K388 Other specified diseases of appendix: Secondary | ICD-10-CM | POA: Diagnosis not present

## 2021-03-26 DIAGNOSIS — K219 Gastro-esophageal reflux disease without esophagitis: Secondary | ICD-10-CM | POA: Diagnosis not present

## 2021-03-26 DIAGNOSIS — K6389 Other specified diseases of intestine: Secondary | ICD-10-CM | POA: Diagnosis not present

## 2021-03-26 DIAGNOSIS — R590 Localized enlarged lymph nodes: Secondary | ICD-10-CM | POA: Diagnosis not present

## 2021-03-26 DIAGNOSIS — I10 Essential (primary) hypertension: Secondary | ICD-10-CM | POA: Diagnosis not present

## 2021-03-26 DIAGNOSIS — Z79899 Other long term (current) drug therapy: Secondary | ICD-10-CM | POA: Diagnosis not present

## 2021-03-26 DIAGNOSIS — K380 Hyperplasia of appendix: Secondary | ICD-10-CM | POA: Diagnosis not present

## 2021-03-26 HISTORY — PX: LAPAROSCOPIC APPENDECTOMY: SHX408

## 2021-03-26 LAB — CBC
HCT: 42.7 % (ref 36.0–46.0)
Hemoglobin: 13.9 g/dL (ref 12.0–15.0)
MCH: 28.4 pg (ref 26.0–34.0)
MCHC: 32.6 g/dL (ref 30.0–36.0)
MCV: 87.3 fL (ref 80.0–100.0)
Platelets: 304 10*3/uL (ref 150–400)
RBC: 4.89 MIL/uL (ref 3.87–5.11)
RDW: 12.7 % (ref 11.5–15.5)
WBC: 5.3 10*3/uL (ref 4.0–10.5)
nRBC: 0 % (ref 0.0–0.2)

## 2021-03-26 LAB — BASIC METABOLIC PANEL
Anion gap: 11 (ref 5–15)
BUN: 11 mg/dL (ref 6–20)
CO2: 24 mmol/L (ref 22–32)
Calcium: 9.5 mg/dL (ref 8.9–10.3)
Chloride: 108 mmol/L (ref 98–111)
Creatinine, Ser: 0.65 mg/dL (ref 0.44–1.00)
GFR, Estimated: >60 mL/min (ref >60)
Glucose, Bld: 96 mg/dL (ref 70–99)
Potassium: 3.6 mmol/L (ref 3.5–5.1)
Sodium: 143 mmol/L (ref 135–145)

## 2021-03-26 SURGERY — APPENDECTOMY, LAPAROSCOPIC
Anesthesia: General

## 2021-03-26 MED ORDER — PROPOFOL 10 MG/ML IV BOLUS
INTRAVENOUS | Status: DC | PRN
  Administered 2021-03-26: 120 mg via INTRAVENOUS

## 2021-03-26 MED ORDER — SCOPOLAMINE 1 MG/3DAYS TD PT72
1.0000 | MEDICATED_PATCH | TRANSDERMAL | Status: DC
  Administered 2021-03-26: 1.5 mg via TRANSDERMAL
  Filled 2021-03-26: qty 1

## 2021-03-26 MED ORDER — ACETAMINOPHEN 325 MG PO TABS: 650.0000 mg | ORAL_TABLET | ORAL | Status: DC | PRN

## 2021-03-26 MED ORDER — FENTANYL CITRATE (PF) 250 MCG/5ML IJ SOLN
INTRAMUSCULAR | Status: DC | PRN
  Administered 2021-03-26 (×3): 50 ug via INTRAVENOUS

## 2021-03-26 MED ORDER — OXYCODONE HCL 5 MG PO TABS: 5.0000 mg | ORAL_TABLET | Freq: Once | ORAL | Status: DC | PRN

## 2021-03-26 MED ORDER — SODIUM CHLORIDE 0.9% FLUSH: 3.0000 mL | Freq: Two times a day (BID) | INTRAVENOUS | Status: DC

## 2021-03-26 MED ORDER — FENTANYL CITRATE (PF) 250 MCG/5ML IJ SOLN
INTRAMUSCULAR | Status: AC
  Filled 2021-03-26: qty 5

## 2021-03-26 MED ORDER — ROCURONIUM BROMIDE 10 MG/ML (PF) SYRINGE
PREFILLED_SYRINGE | INTRAVENOUS | Status: AC
  Filled 2021-03-26: qty 10

## 2021-03-26 MED ORDER — MIDAZOLAM HCL 2 MG/2ML IJ SOLN
INTRAMUSCULAR | Status: AC
  Filled 2021-03-26: qty 2

## 2021-03-26 MED ORDER — ACETAMINOPHEN 650 MG RE SUPP
650.0000 mg | RECTAL | Status: DC | PRN
  Filled 2021-03-26: qty 1

## 2021-03-26 MED ORDER — LACTATED RINGERS IR SOLN
Status: DC | PRN
  Administered 2021-03-26: 1000 mL

## 2021-03-26 MED ORDER — ONDANSETRON HCL 4 MG/2ML IJ SOLN
INTRAMUSCULAR | Status: AC
  Filled 2021-03-26: qty 2

## 2021-03-26 MED ORDER — LACTATED RINGERS IV SOLN: INTRAVENOUS | Status: DC

## 2021-03-26 MED ORDER — FENTANYL CITRATE (PF) 100 MCG/2ML IJ SOLN: 25.0000 ug | INTRAMUSCULAR | Status: DC | PRN

## 2021-03-26 MED ORDER — PROMETHAZINE HCL 25 MG/ML IJ SOLN: 6.2500 mg | INTRAMUSCULAR | Status: DC | PRN

## 2021-03-26 MED ORDER — SUGAMMADEX SODIUM 200 MG/2ML IV SOLN
INTRAVENOUS | Status: DC | PRN
  Administered 2021-03-26: 300 mg via INTRAVENOUS

## 2021-03-26 MED ORDER — DEXAMETHASONE SODIUM PHOSPHATE 10 MG/ML IJ SOLN
INTRAMUSCULAR | Status: DC | PRN
  Administered 2021-03-26: 10 mg via INTRAVENOUS

## 2021-03-26 MED ORDER — CHLORHEXIDINE GLUCONATE 0.12 % MT SOLN
15.0000 mL | Freq: Once | OROMUCOSAL | Status: AC
  Administered 2021-03-26: 15 mL via OROMUCOSAL

## 2021-03-26 MED ORDER — LIDOCAINE 2% (20 MG/ML) 5 ML SYRINGE
INTRAMUSCULAR | Status: AC
  Filled 2021-03-26: qty 5

## 2021-03-26 MED ORDER — ACETAMINOPHEN 500 MG PO TABS
1000.0000 mg | ORAL_TABLET | ORAL | Status: AC
  Administered 2021-03-26: 1000 mg via ORAL
  Filled 2021-03-26: qty 2

## 2021-03-26 MED ORDER — DEXAMETHASONE SODIUM PHOSPHATE 10 MG/ML IJ SOLN
INTRAMUSCULAR | Status: AC
  Filled 2021-03-26: qty 1

## 2021-03-26 MED ORDER — LIDOCAINE 2% (20 MG/ML) 5 ML SYRINGE
INTRAMUSCULAR | Status: DC | PRN
  Administered 2021-03-26: 80 mg via INTRAVENOUS

## 2021-03-26 MED ORDER — SUCCINYLCHOLINE CHLORIDE 200 MG/10ML IV SOSY
PREFILLED_SYRINGE | INTRAVENOUS | Status: DC | PRN
  Administered 2021-03-26: 100 mg via INTRAVENOUS

## 2021-03-26 MED ORDER — SODIUM CHLORIDE 0.9 % IV SOLN
2.0000 g | INTRAVENOUS | Status: AC
  Administered 2021-03-26: 2 g via INTRAVENOUS
  Filled 2021-03-26: qty 2

## 2021-03-26 MED ORDER — BUPIVACAINE-EPINEPHRINE 0.5% -1:200000 IJ SOLN
INTRAMUSCULAR | Status: DC | PRN
  Administered 2021-03-26: 6 mL

## 2021-03-26 MED ORDER — ACETAMINOPHEN 325 MG PO TABS
ORAL_TABLET | ORAL | Status: AC
  Administered 2021-03-26: 650 mg via ORAL
  Filled 2021-03-26: qty 2

## 2021-03-26 MED ORDER — DROPERIDOL 2.5 MG/ML IJ SOLN: 0.6250 mg | Freq: Once | INTRAMUSCULAR | Status: DC | PRN

## 2021-03-26 MED ORDER — SODIUM CHLORIDE 0.9% FLUSH: 3.0000 mL | INTRAVENOUS | Status: DC | PRN

## 2021-03-26 MED ORDER — ORAL CARE MOUTH RINSE: 15.0000 mL | Freq: Once | OROMUCOSAL | Status: AC

## 2021-03-26 MED ORDER — TRAMADOL HCL 50 MG PO TABS: 50.0000 mg | ORAL_TABLET | Freq: Four times a day (QID) | ORAL | 0 refills | Status: DC | PRN

## 2021-03-26 MED ORDER — ONDANSETRON HCL 4 MG/2ML IJ SOLN
INTRAMUSCULAR | Status: DC | PRN
  Administered 2021-03-26: 4 mg via INTRAVENOUS

## 2021-03-26 MED ORDER — MIDAZOLAM HCL 2 MG/2ML IJ SOLN
INTRAMUSCULAR | Status: DC | PRN
  Administered 2021-03-26: 2 mg via INTRAVENOUS

## 2021-03-26 MED ORDER — OXYCODONE HCL 5 MG/5ML PO SOLN: 5.0000 mg | Freq: Once | ORAL | Status: DC | PRN

## 2021-03-26 MED ORDER — SODIUM CHLORIDE 0.9 % IV SOLN: 250.0000 mL | INTRAVENOUS | Status: DC | PRN

## 2021-03-26 MED ORDER — BUPIVACAINE-EPINEPHRINE 0.5% -1:200000 IJ SOLN
INTRAMUSCULAR | Status: AC
  Filled 2021-03-26: qty 1

## 2021-03-26 MED ORDER — PHENYLEPHRINE 40 MCG/ML (10ML) SYRINGE FOR IV PUSH (FOR BLOOD PRESSURE SUPPORT)
PREFILLED_SYRINGE | INTRAVENOUS | Status: DC | PRN
  Administered 2021-03-26: 80 ug via INTRAVENOUS

## 2021-03-26 MED ORDER — ROCURONIUM BROMIDE 10 MG/ML (PF) SYRINGE
PREFILLED_SYRINGE | INTRAVENOUS | Status: DC | PRN
  Administered 2021-03-26: 50 mg via INTRAVENOUS

## 2021-03-26 MED ORDER — PROPOFOL 10 MG/ML IV BOLUS
INTRAVENOUS | Status: AC
  Filled 2021-03-26: qty 40

## 2021-03-26 SURGICAL SUPPLY — 40 items
APPLIER CLIP 5 13 M/L LIGAMAX5 (MISCELLANEOUS)
APPLIER CLIP ROT 10 11.4 M/L (STAPLE)
CABLE HIGH FREQUENCY MONO STRZ (ELECTRODE) ×2 IMPLANT
CHLORAPREP W/TINT 26 (MISCELLANEOUS) ×2 IMPLANT
CLIP APPLIE 5 13 M/L LIGAMAX5 (MISCELLANEOUS) IMPLANT
CLIP APPLIE ROT 10 11.4 M/L (STAPLE) IMPLANT
COVER WAND RF STERILE (DRAPES) IMPLANT
DECANTER SPIKE VIAL GLASS SM (MISCELLANEOUS) ×2 IMPLANT
DERMABOND ADVANCED (GAUZE/BANDAGES/DRESSINGS) ×1
DERMABOND ADVANCED .7 DNX12 (GAUZE/BANDAGES/DRESSINGS) ×1 IMPLANT
DRAPE LAPAROSCOPIC ABDOMINAL (DRAPES) IMPLANT
ELECT REM PT RETURN 15FT ADLT (MISCELLANEOUS) ×2 IMPLANT
GLOVE SURG ENC MOIS LTX SZ6.5 (GLOVE) ×2 IMPLANT
GLOVE SURG UNDER POLY LF SZ7 (GLOVE) ×2 IMPLANT
GOWN STRL REUS W/TWL XL LVL3 (GOWN DISPOSABLE) ×4 IMPLANT
GRASPER SUT TROCAR 14GX15 (MISCELLANEOUS) IMPLANT
HANDLE STAPLE EGIA 4 XL (STAPLE) ×2 IMPLANT
IRRIG SUCT STRYKERFLOW 2 WTIP (MISCELLANEOUS) ×2
IRRIGATION SUCT STRKRFLW 2 WTP (MISCELLANEOUS) ×1 IMPLANT
KIT BASIN OR (CUSTOM PROCEDURE TRAY) ×2 IMPLANT
KIT TURNOVER KIT A (KITS) ×2 IMPLANT
MARKER SKIN DUAL TIP RULER LAB (MISCELLANEOUS) IMPLANT
PENCIL SMOKE EVACUATOR (MISCELLANEOUS) IMPLANT
POUCH SPECIMEN RETRIEVAL 10MM (ENDOMECHANICALS) ×2 IMPLANT
RELOAD EGIA 45 MED/THCK PURPLE (STAPLE) IMPLANT
RELOAD EGIA 45 TAN VASC (STAPLE) IMPLANT
RELOAD EGIA 60 MED/THCK PURPLE (STAPLE) ×2 IMPLANT
RELOAD EGIA 60 TAN VASC (STAPLE) IMPLANT
SCISSORS LAP 5X35 DISP (ENDOMECHANICALS) ×2 IMPLANT
SHEARS HARMONIC ACE PLUS 36CM (ENDOMECHANICALS) ×2 IMPLANT
SLEEVE XCEL OPT CAN 5 100 (ENDOMECHANICALS) ×2 IMPLANT
SUT VIC AB 2-0 SH 27 (SUTURE)
SUT VIC AB 2-0 SH 27X BRD (SUTURE) IMPLANT
SUT VIC AB 4-0 PS2 27 (SUTURE) ×2 IMPLANT
SUT VICRYL 0 UR6 27IN ABS (SUTURE) ×2 IMPLANT
TOWEL OR 17X26 10 PK STRL BLUE (TOWEL DISPOSABLE) ×2 IMPLANT
TRAY FOLEY MTR SLVR 16FR STAT (SET/KITS/TRAYS/PACK) ×2 IMPLANT
TRAY LAPAROSCOPIC (CUSTOM PROCEDURE TRAY) ×2 IMPLANT
TROCAR BLADELESS OPT 5 100 (ENDOMECHANICALS) ×2 IMPLANT
TROCAR XCEL BLUNT TIP 100MML (ENDOMECHANICALS) ×2 IMPLANT

## 2021-03-26 NOTE — Anesthesia Preprocedure Evaluation (Addendum)
Anesthesia Evaluation  Patient identified by MRN, date of birth, ID band Patient awake    Reviewed: Allergy & Precautions, NPO status , Patient's Chart, lab work & pertinent test results  History of Anesthesia Complications (+) DIFFICULT AIRWAYHistory of anesthetic complications: see 7/59/16 anesthetic record.  Airway Mallampati: II  TM Distance: >3 FB Neck ROM: Full  Mouth opening: Limited Mouth Opening  Dental no notable dental hx.    Pulmonary neg pulmonary ROS,    Pulmonary exam normal        Cardiovascular hypertension, Pt. on medications Normal cardiovascular exam     Neuro/Psych negative neurological ROS  negative psych ROS   GI/Hepatic Neg liver ROS, GERD  Medicated and Controlled,Cecal mass   Endo/Other    Renal/GU negative Renal ROS  negative genitourinary   Musculoskeletal negative musculoskeletal ROS (+)   Abdominal (+) - obese,   Peds negative pediatric ROS (+)  Hematology   Anesthesia Other Findings   Reproductive/Obstetrics                            Anesthesia Physical  Anesthesia Plan  ASA: II  Anesthesia Plan: General   Post-op Pain Management:    Induction: Intravenous  PONV Risk Score and Plan: 4 or greater and Ondansetron, Dexamethasone, Midazolam and Scopolamine patch - Pre-op  Airway Management Planned: Oral ETT and Video Laryngoscope Planned  Additional Equipment: None  Intra-op Plan:   Post-operative Plan: Extubation in OR  Informed Consent: I have reviewed the patients History and Physical, chart, labs and discussed the procedure including the risks, benefits and alternatives for the proposed anesthesia with the patient or authorized representative who has indicated his/her understanding and acceptance.     Dental advisory given  Plan Discussed with: CRNA and Anesthesiologist  Anesthesia Plan Comments: (Glidescope for intubation. Flexible  fiberoptic available. GETA. Norton Blizzard, MD  )      Anesthesia Quick Evaluation

## 2021-03-26 NOTE — Discharge Instructions (Signed)
[facility] REPAIR: POST OP INSTRUCTIONS  ######################################################################  EAT Gradually transition to a high fiber diet with a fiber supplement over the next few weeks after discharge.  Start with a pureed / full liquid diet (see below)  WALK Walk an hour a day.  Control your pain to do that.    CONTROL PAIN Control pain so that you can walk, sleep, tolerate sneezing/coughing, and go up/down stairs.  HAVE A BOWEL MOVEMENT DAILY Keep your bowels regular to avoid problems.  OK to try a laxative to override constipation.  OK to use an antidairrheal to slow down diarrhea.  Call if not better after 2 tries  CALL IF YOU HAVE PROBLEMS/CONCERNS Call if you are still struggling despite following these instructions. Call if you have concerns not answered by these instructions  ######################################################################    1. DIET: Follow a light bland diet the first 24 hours after arrival home, such as soup, liquids, crackers, etc.  Be sure to include lots of fluids daily.  Advance to a low fat / high fiber diet over the next few days after surgery.  Avoid fast food or heavy meals the first week as your are more likely to get nauseated.    2. Take your usually prescribed home medications unless otherwise directed.  3. PAIN CONTROL: a. Pain is best controlled by a usual combination of three different methods TOGETHER: i. Ice/Heat ii. Over the counter pain medication iii. Prescription pain medication b. Most patients will experience some swelling and bruising around the incision(s).  Ice packs or heating pads (30-60 minutes up to 6 times a day) will help. Use ice for the first few days to help decrease swelling and bruising, then switch to heat to help relax tight/sore spots and speed recovery.  Some people prefer to use ice alone, heat alone, alternating between ice & heat.  Experiment to what works for you.  Swelling and bruising  can take several weeks to resolve.   c. It is helpful to take an over-the-counter pain medication regularly for the first few weeks.  Choose one of the following that works best for you: i. Naproxen (Aleve, etc)  Two 220mg  tabs twice a day ii. Ibuprofen (Advil, etc) Three 200mg  tabs four times a day (every meal & bedtime) iii. Acetaminophen (Tylenol, etc) 325-650mg  four times a day (every meal & bedtime) d. A  prescription for pain medication should be given to you upon discharge.  Take your pain medication as prescribed.  i. If you are having problems/concerns with the prescription medicine (does not control pain, nausea, vomiting, rash, itching, etc), please call us [phone] to see if we need to switch you to a different pain medicine that will work better for you and/or control your side effect better. ii. If you need a refill on your pain medication, please contact your pharmacy.  They will contact our office to request authorization. Prescriptions will not be filled after 5 pm or on week-ends.  4. Avoid getting constipated.  Between the surgery and the pain medications, it is common to experience some constipation.  Increasing fluid intake and taking a fiber supplement (such as Metamucil, Citrucel, FiberCon, MiraLax, etc) 1-2 times a day regularly will usually help prevent this problem from occurring.  A mild laxative (prune juice, Milk of Magnesia, MiraLax, etc) should be taken according to package directions if there are no bowel movements after 48 hours.    5. Wash / shower every day.  You may shower over the dressings  as they are waterproof.    6.  You may leave the incisions open to air.  There is waterproof glue on it.  It is ok to shower over this.  You may replace a dressing/Band-Aid to cover an incision for comfort if you wish.  Continue to shower over incision(s) after the dressing is off.  7. ACTIVITIES as tolerated:   a. You may resume regular (light) daily activities beginning  the next day--such as daily self-care, walking, climbing stairs--gradually increasing activities as tolerated.  Control your pain so that you can walk an hour a day.  If you can walk 30 minutes without difficulty, it is safe to try more intense activity such as jogging, treadmill, bicycling, low-impact aerobics, swimming, etc. b. Save the most intensive and strenuous activity for last such as sit-ups, heavy lifting, contact sports, etc  Refrain from any heavy lifting or straining until you are off narcotics for pain control.   c. DO NOT PUSH THROUGH PAIN.  Let pain be your guide: If it hurts to do something, don't do it.  Pain is your body warning you to avoid that activity for another week until the pain goes down. d. You may drive when you are no longer taking prescription pain medication, you can comfortably wear a seatbelt, and you can safely maneuver your car and apply brakes. e. Dennis Bast may have sexual intercourse when it is comfortable.   8. FOLLOW UP in our office a. Please call [facility] at [phone] to set up an appointment to see your surgeon in the office for a follow-up appointment approximately 2-3 weeks after your surgery. b. Make sure that you call for this appointment the day you arrive home to insure a convenient appointment time.  9.  If you have disability of FMLA / Family leave forms, please bring the forms to the office for processing.  (do not give to your surgeon).  WHEN TO CALL us [phone]: 1. Poor pain control 2. Reactions / problems with new medications (rash/itching, nausea, etc)  3. Fever over 101.5 F (38.5 C) 4. Inability to urinate 5. Nausea and/or vomiting 6. Worsening swelling or bruising 7. Continued bleeding from incision. 8. Increased pain, redness, or drainage from the incision   The clinic staff is available to answer your questions during regular business hours (8:30am-5pm).  Please don't hesitate to call and ask to speak to one of our nurses for clinical  concerns.   If you have a medical emergency, go to the nearest emergency room or call 911.  A surgeon from [facility] is always on call at the hospitals in [facility], [place], [place], [place], [place]  [address] ?  [address] [address], [place], [place]   [address] MAIN: [phone] ? TOLL FREE: [phone] ? FAX: [phone] [email or web address]

## 2021-03-26 NOTE — Transfer of Care (Signed)
Immediate Anesthesia Transfer of Care Note  Patient: Tara Greene  Procedure(s) Performed: APPENDECTOMY LAPAROSCOPIC (N/A )  Patient Location: PACU  Anesthesia Type:General  Level of Consciousness: awake, alert  and oriented  Airway & Oxygen Therapy: Patient Spontanous Breathing and Patient connected to face mask oxygen  Post-op Assessment: Report given to RN and Post -op Vital signs reviewed and stable  Post vital signs: Reviewed and stable  Last Vitals:  Vitals Value Taken Time  BP 91/62 03/26/21 1600  Temp 37.1 C 03/26/21 1508  Pulse 62 03/26/21 1602  Resp 23 03/26/21 1602  SpO2 100 % 03/26/21 1602  Vitals shown include unvalidated device data.  Last Pain:  Vitals:   03/26/21 1530  TempSrc:   PainSc: 0-No pain      Patients Stated Pain Goal: 4 (83/38/25 0539)  Complications: No complications documented.

## 2021-03-26 NOTE — Anesthesia Procedure Notes (Addendum)
Procedure Name: Intubation Performed by: Gean Maidens, CRNA Pre-anesthesia Checklist: Patient identified, Emergency Drugs available, Suction available, Patient being monitored and Timeout performed Patient Re-evaluated:Patient Re-evaluated prior to induction Oxygen Delivery Method: Circle system utilized Preoxygenation: Pre-oxygenation with 100% oxygen Induction Type: IV induction Ventilation: Mask ventilation without difficulty Laryngoscope Size: Glidescope and 3 Grade View: Grade II Tube type: Oral Tube size: 7.0 mm Number of attempts: 1 Airway Equipment and Method: Video-laryngoscopy Placement Confirmation: ETT inserted through vocal cords under direct vision,  positive ETCO2 and breath sounds checked- equal and bilateral Secured at: 21 cm Tube secured with: Tape Dental Injury: Teeth and Oropharynx as per pre-operative assessment  Difficulty Due To: Difficulty was anticipated Comments: Anterior larynx, recommend video laryngoscopy.

## 2021-03-26 NOTE — Interval H&P Note (Signed)
History and Physical Interval Note:  03/26/2021 10:26 AM  Tara Greene  has presented today for surgery, with the diagnosis of Cecal Mass.  The various methods of treatment have been discussed with the patient and family. After consideration of risks, benefits and other options for treatment, the patient has consented to  Procedure(s) with comments: APPENDECTOMY LAPAROSCOPIC (N/A) - 60 minutes as a surgical intervention.  The patient's history has been reviewed, patient examined, no change in status, stable for surgery.  I have reviewed the patient's chart and labs.  Questions were answered to the patient's satisfaction.     Rosario Adie, MD  [facility]

## 2021-03-26 NOTE — Op Note (Signed)
Tara Greene [id number]   PRE-OPERATIVE DIAGNOSIS:  Cecal Mass  POST-OPERATIVE DIAGNOSIS:  cecal mass   Procedure(s): APPENDECTOMY LAPAROSCOPIC    Surgeon(s): Leighton Ruff, MD  ASSISTANT: none   ANESTHESIA:   local and general  EBL:   10 ml  Delay start of Pharmacological VTE agent (>24hrs) due to surgical blood loss or risk of bleeding:  no  DRAINS: none   SPECIMEN:  Source of Specimen:  appendix  DISPOSITION OF SPECIMEN:  PATHOLOGY  COUNTS:  YES  PLAN OF CARE: Discharge to home after PACU  PATIENT DISPOSITION:  PACU - hemodynamically stable.   INDICATIONS: Patient with concerning symptoms & work up suspicious for appendicitis.  Surgery was recommended:  The anatomy & physiology of the digestive tract was discussed.  The pathophysiology of appendicitis was discussed.  Natural history risks without surgery was discussed.   I feel the risks of no intervention will lead to serious problems that outweigh the operative risks; therefore, I recommended diagnostic laparoscopy with removal of appendix to remove the pathology.  Laparoscopic & open techniques were discussed.   I noted a good likelihood this will help address the problem.    Risks such as bleeding, infection, abscess, leak, reoperation, possible ostomy, hernia, heart attack, death, and other risks were discussed.  Goals of post-operative recovery were discussed as well.  We will work to minimize complications.  Questions were answered.  The patient expresses understanding & wishes to proceed with surgery.  OR FINDINGS: normal appearing appendix.  Cystic mass at appendiceal orifice.  DESCRIPTION:   The patient was identified & brought into the operating room. The patient was positioned supine with left arm tucked. SCDs were active during the entire case. The patient underwent general anesthesia without any difficulty.  A foley catheter was inserted under sterile conditions. The abdomen was prepped and draped in  a sterile fashion. A Surgical Timeout confirmed our plan.   I made a transverse incision through the inferior umbilical fold.  I made a nick in the infraumbilical fascia and confirmed peritoneal entry.  I placed a stay suture and then the [facility] port.  We induced carbon dioxide insufflation.  Camera inspection revealed no injury.  I placed additional ports under direct laparoscopic visualization.  I mobilized the terminal ileum to proximal ascending colon in a lateral to medial fashion.  I took care to avoid injuring any retroperitoneal structures.   I freed the appendix off its attachments to the ascending colon and cecal mesentery usinga harmonic scalpel.  I elevated the appendix.  I was able to free off the base of the appendix, which was still viable.  I stapled the appendix off the cecum using a laparoscopic Covidian purple load stapler.  I took a healthy cuff viable cecum along with the mass. I skeletonized & ligated the mesoappendix with the harmonic scalpel.  I placed the appendix inside an EndoCatch bag and removed out the [place] port.  I did copious irrigation. Hemostasis was good in the mesoappendix, colon mesentery, and retroperitoneum. Staple line was intact on the cecum with no bleeding. I washed out the pelvis, retrohepatic space and right paracolic gutter.  Hemostasis is good. There was no perforation or injury.   I aspirated the carbon dioxide. I removed the ports. I closed the umbilical fascia site using a 0 Vicryl stitch. The skin was closed skin using 4-0 vicryl stitch.  Sterile dressings were applied.  Patient was extubated and sent to the recovery room.  I discussed the operative  findings with the patient's family.  Patient should be ready for discharge later today.

## 2021-03-29 ENCOUNTER — Encounter (HOSPITAL_COMMUNITY): Payer: Self-pay | Admitting: General Surgery

## 2021-03-30 LAB — SURGICAL PATHOLOGY

## 2021-03-30 NOTE — Anesthesia Postprocedure Evaluation (Signed)
Anesthesia Post Note  Patient: Tara Greene  Procedure(s) Performed: APPENDECTOMY LAPAROSCOPIC (N/A )     Patient location during evaluation: PACU Anesthesia Type: General Level of consciousness: awake and alert Pain management: pain level controlled Vital Signs Assessment: post-procedure vital signs reviewed and stable Respiratory status: spontaneous breathing, nonlabored ventilation, respiratory function stable and patient connected to nasal cannula oxygen Cardiovascular status: blood pressure returned to baseline and stable Postop Assessment: no apparent nausea or vomiting Anesthetic complications: no   No complications documented.  Last Vitals:  Vitals:   03/26/21 1638 03/26/21 1645  BP: 102/70 102/70  Pulse: 63 64  Resp: 14 16  Temp: (!) 36.3 C   SpO2: 97% 99%    Last Pain:  Vitals:   03/26/21 1645  TempSrc:   PainSc: 5                  Shanikka Wonders

## 2021-04-19 ENCOUNTER — Other Ambulatory Visit: Payer: Self-pay | Admitting: General Surgery

## 2021-04-19 DIAGNOSIS — K6389 Other specified diseases of intestine: Secondary | ICD-10-CM

## 2021-06-18 ENCOUNTER — Other Ambulatory Visit (HOSPITAL_BASED_OUTPATIENT_CLINIC_OR_DEPARTMENT_OTHER): Payer: Self-pay

## 2021-06-18 MED FILL — Omeprazole Cap Delayed Release 40 MG: ORAL | 90 days supply | Qty: 90 | Fill #0 | Status: AC

## 2021-08-05 ENCOUNTER — Ambulatory Visit (HOSPITAL_COMMUNITY)
Admission: RE | Admit: 2021-08-05 | Discharge: 2021-08-05 | Disposition: A | Discharge status: Home / Self Care | Payer: 59 | Source: Ambulatory Visit | Attending: Emergency Medicine | Admitting: Emergency Medicine

## 2021-08-05 ENCOUNTER — Other Ambulatory Visit: Payer: Self-pay

## 2021-08-05 ENCOUNTER — Other Ambulatory Visit (HOSPITAL_BASED_OUTPATIENT_CLINIC_OR_DEPARTMENT_OTHER): Payer: Self-pay

## 2021-08-05 ENCOUNTER — Encounter (HOSPITAL_COMMUNITY): Payer: Self-pay

## 2021-08-05 VITALS — BP 149/80 | HR 74 | Temp 98.0°F | Resp 18

## 2021-08-05 DIAGNOSIS — M5412 Radiculopathy, cervical region: Secondary | ICD-10-CM

## 2021-08-05 MED ORDER — DEXAMETHASONE SODIUM PHOSPHATE 10 MG/ML IJ SOLN
10.0000 mg | Freq: Once | INTRAMUSCULAR | Status: AC
  Administered 2021-08-05: 10 mg via INTRAMUSCULAR

## 2021-08-05 MED ORDER — PREDNISONE 20 MG PO TABS
40.0000 mg | ORAL_TABLET | Freq: Every day | ORAL | 0 refills | Status: AC
  Filled 2021-08-05: qty 10, 5d supply, fill #0

## 2021-08-05 MED ORDER — CYCLOBENZAPRINE HCL 10 MG PO TABS
10.0000 mg | ORAL_TABLET | Freq: Two times a day (BID) | ORAL | 0 refills | Status: DC | PRN
  Filled 2021-08-05: qty 10, 5d supply, fill #0

## 2021-08-05 MED ORDER — DEXAMETHASONE SODIUM PHOSPHATE 10 MG/ML IJ SOLN
INTRAMUSCULAR | Status: AC
  Filled 2021-08-05: qty 1

## 2021-08-05 NOTE — ED Provider Notes (Addendum)
[facility]    CSN: [id number] Arrival date & time: 08/05/21  1544      History   Chief Complaint Chief Complaint  Patient presents with   appt   Neck Pain   Arm Pain    HPI Tara Greene is a 50 y.o. female.   Patient here for evaluation of right-sided neck pain that radiates down the right arm.  Reports symptoms started on Sunday.  Reports some numbness and tingling in right arm and hand.  Reports taking Tylenol with minimal symptom relief.  Reports taking ibuprofen with minimal symptom relief.  Denies any trauma, injury, or other precipitating event.  Denies any specific alleviating or aggravating factors.  Denies any fevers, chest pain, shortness of breath, N/V/D, weakness, abdominal pain, or headaches.    The history is provided by the patient.  Neck Pain Arm Pain   Past Medical History:  Diagnosis Date   Abnormal TSH 06/14/2015   Allergy    Anemia    Dehydration 01/27/2017   Dermatitis 10/18/2013   GERD (gastroesophageal reflux disease)    Hyperlipidemia    Hypertension    Other and unspecified hyperlipidemia 10/18/2013   Perimenopause 04/12/2014    Patient Active Problem List   Diagnosis Date Noted   Family history of colon cancer in mother 11/06/2020   Colon cancer screening 11/06/2020   Neck pain 08/21/2017   HTN (hypertension) 07/10/2016   GERD (gastroesophageal reflux disease) 07/10/2016   Abnormal TSH 06/14/2015   Perimenopause 04/12/2014   Eczema 10/18/2013   Allergy    Anemia 09/15/2012   Hyperlipidemia 09/15/2012   Encounter for routine gynecological examination 09/27/2011   Uterine mass 09/27/2011   Preventative health care 09/20/2011    Past Surgical History:  Procedure Laterality Date   COLONOSCOPY     LAPAROSCOPIC APPENDECTOMY N/A 03/11/2021   Procedure: APPENDECTOMY LAPAROSCOPIC;  Surgeon: Leighton Ruff, MD;  Location: [facility];  Service: General;  Laterality: N/A;  60 minutes   LAPAROSCOPIC APPENDECTOMY N/A 03/26/2021    Procedure: APPENDECTOMY LAPAROSCOPIC;  Surgeon: Leighton Ruff, MD;  Location: [facility];  Service: General;  Laterality: N/A;  60 minutes   NO PAST SURGERIES      OB History   No obstetric history on file.      Home Medications    Prior to Admission medications   Medication Sig Start Date End Date Taking? Authorizing Provider  cyclobenzaprine (FLEXERIL) 10 MG tablet Take 1 tablet (10 mg total) by mouth 2 (two) times daily as needed for muscle spasms. 08/05/21  Yes Pearson Forster, NP  predniSONE (DELTASONE) 20 MG tablet Take 2 tablets (40 mg total) by mouth daily for 5 days. 08/05/21 08/10/21 Yes Pearson Forster, NP  amLODipine (NORVASC) 10 MG tablet TAKE 1 TABLET BY MOUTH ONCE DAILY Patient taking differently: Take 10 mg by mouth daily. 03/09/21 03/09/22  Mosie Lukes, MD  cholecalciferol (VITAMIN D) 1000 units tablet Take 1 tablet (1,000 Units total) by mouth daily. 04/27/18   Debbrah Alar, NP  fexofenadine (ALLEGRA) 180 MG tablet Take 1 tablet (180 mg total) by mouth daily. Patient taking differently: Take 180 mg by mouth daily as needed for allergies. 04/07/14   Mosie Lukes, MD  Multiple Vitamin (MULTIVITAMIN) tablet Take 1 tablet by mouth daily.    [provider]  Na Sulfate-K Sulfate-Mg Sulf 17.5-3.13-1.6 GM/177ML SOLN TAKE 1 KIT BY MOUTH AS DIRECTED. FOR COLONOSCOPY PREP 11/05/20 11/05/21  Mansouraty, Telford Nab., MD  Omega-3 Fatty Acids ([place]  OIL) 1000 MG CAPS Take 1,000 mg by mouth daily.    [provider]  omeprazole (PRILOSEC) 40 MG capsule TAKE 1 CAPSULE (40 MG TOTAL) BY MOUTH DAILY AS NEEDED. Patient taking differently: Take 40 mg by mouth daily as needed (acid reflux). 11/26/20 11/26/21  Mosie Lukes, MD  traMADol (ULTRAM) 50 MG tablet Take 1-2 tablets (50-100 mg total) by mouth every 6 (six) hours as needed. 4/97/02   Leighton Ruff, MD  triamcinolone cream (KENALOG) 0.1 % Apply 1 application topically 2 (two) times daily. As needed for rash Patient  taking differently: Apply 1 application topically 2 (two) times daily as needed (rash). 08/24/20   Mosie Lukes, MD    Family History Family History  Problem Relation Age of Onset   Hypertension Mother    Hyperlipidemia Mother    Cancer Mother 72       colon    Colon cancer Mother    COPD Father    Breast cancer Neg Hx    Esophageal cancer Neg Hx    Pancreatic cancer Neg Hx    Stomach cancer Neg Hx    Inflammatory bowel disease Neg Hx    Liver disease Neg Hx     Social History Social History   Tobacco Use   Smoking status: Never   Smokeless tobacco: Never  Vaping Use   Vaping Use: Never used  Substance Use Topics   Alcohol use: No   Drug use: No     Allergies   Patient has no known allergies.   Review of Systems Review of Systems  Musculoskeletal:  Positive for arthralgias and neck pain.  All other systems reviewed and are negative.   Physical Exam Triage Vital Signs ED Triage Vitals  Enc Vitals Group     BP 08/05/21 1606 (!) 149/80     Pulse Rate 08/05/21 1606 74     Resp 08/05/21 1606 18     Temp 08/05/21 1606 98 F (36.7 C)     Temp Source 08/05/21 1606 Oral     SpO2 08/05/21 1606 97 %     Weight --      Height --      Head Circumference --      Peak Flow --      Pain Score 08/05/21 1604 10     Pain Loc --      Pain Edu? --      Excl. in [place]? --    No data found.  Updated Vital Signs BP (!) 149/80 (BP Location: Right Arm)   Pulse 74   Temp 98 F (36.7 C) (Oral)   Resp 18   LMP 01/08/2016 Comment: Irregular per patient  SpO2 97%   Visual Acuity Right Eye Distance:   Left Eye Distance:   Bilateral Distance:    Right Eye Near:   Left Eye Near:    Bilateral Near:     Physical Exam Vitals and nursing note reviewed.  Constitutional:      General: She is not in acute distress.    Appearance: Normal appearance. She is not ill-appearing, toxic-appearing or diaphoretic.  HENT:     Head: Normocephalic and atraumatic.  Eyes:      Conjunctiva/sclera: Conjunctivae normal.  Cardiovascular:     Rate and Rhythm: Normal rate.     Pulses: Normal pulses.  Pulmonary:     Effort: Pulmonary effort is normal.  Abdominal:     General: Abdomen is flat.  Musculoskeletal:  General: Normal range of motion.     Left shoulder: Tenderness present. No swelling or bony tenderness. Normal range of motion. Normal strength. Normal pulse.     Cervical back: Normal range of motion. Spasms and tenderness present. No bony tenderness. No pain with movement. Normal range of motion.  Skin:    General: Skin is warm and dry.  Neurological:     General: No focal deficit present.     Mental Status: She is alert and oriented to person, place, and time.  Psychiatric:        Mood and Affect: Mood normal.     UC Treatments / Results  Labs (all labs ordered are listed, but only abnormal results are displayed) Labs Reviewed - No data to display  EKG   Radiology No results found.  Procedures Procedures (including critical care time)  Medications Ordered in UC Medications  dexamethasone (DECADRON) injection 10 mg (has no administration in time range)    Initial Impression / Assessment and Plan / UC Course  I have reviewed the triage vital signs and the nursing notes.  Pertinent labs & imaging results that were available during my care of the patient were reviewed by me and considered in my medical decision making (see chart for details).    Assessment negative for red flags or concerns.  Cervical radiculopathy.  Decadron IM given in office.  Prednisone burst starting tomorrow.  May take Tylenol as needed for pain.  May take flexeril as needed for muscle pain and spasms but do not take it prior to driving as it can cause drowsiness. Discussed conservative symptom management as described in discharge instructions.  Recommend following up with orthopedics if symptoms do not improve in the next few weeks.  Follow-up with primary care as  scheduled Final Clinical Impressions(s) / UC Diagnoses   Final diagnoses:  Cervical radiculopathy     Discharge Instructions      Take the prednisone daily starting tomorrow.  Take it in the morning.    You can also take Tylenol as needed for pain.  Do not take any Ibuprofen or naproxen while taking the prednisone.   You can use heat, ice, or alternate between heat and ice for comfort.  You can also use IcyHot, lidocaine patches, asper cream, and biofreeze as needed for pain relief.   Follow up with orthopedics or sports medicine for re-evaluation if your symptoms do not improve in the next few weeks.  Follow up with primary care provider as scheduled.      ED Prescriptions     Medication Sig Dispense Auth. Provider   predniSONE (DELTASONE) 20 MG tablet Take 2 tablets (40 mg total) by mouth daily for 5 days. 10 tablet Pearson Forster, NP   cyclobenzaprine (FLEXERIL) 10 MG tablet Take 1 tablet (10 mg total) by mouth 2 (two) times daily as needed for muscle spasms. 10 tablet Pearson Forster, NP      PDMP not reviewed this encounter.   Pearson Forster, NP 08/05/21 1634    Pearson Forster, NP 08/05/21 [phone]

## 2021-08-05 NOTE — ED Triage Notes (Signed)
Pt reports since Sunday having worsening neck pains that radiates down right arm. Reports has numbness in right arm to hand as well. Denies falls or injuries, does do lifting at work.

## 2021-08-05 NOTE — Discharge Instructions (Signed)
Take the prednisone daily starting tomorrow.  Take it in the morning.    You can also take Tylenol as needed for pain.  Do not take any Ibuprofen or naproxen while taking the prednisone.   You can use heat, ice, or alternate between heat and ice for comfort.  You can also use IcyHot, lidocaine patches, asper cream, and biofreeze as needed for pain relief.   Follow up with orthopedics or sports medicine for re-evaluation if your symptoms do not improve in the next few weeks.  Follow up with primary care provider as scheduled.

## 2021-08-17 ENCOUNTER — Other Ambulatory Visit (HOSPITAL_BASED_OUTPATIENT_CLINIC_OR_DEPARTMENT_OTHER): Payer: Self-pay

## 2021-08-17 ENCOUNTER — Ambulatory Visit (HOSPITAL_BASED_OUTPATIENT_CLINIC_OR_DEPARTMENT_OTHER)
Admission: RE | Admit: 2021-08-17 | Discharge: 2021-08-17 | Disposition: A | Discharge status: Home / Self Care | Payer: 59 | Source: Ambulatory Visit | Attending: Family Medicine | Admitting: Family Medicine

## 2021-08-17 ENCOUNTER — Other Ambulatory Visit: Payer: Self-pay

## 2021-08-17 ENCOUNTER — Encounter: Payer: Self-pay | Admitting: Family Medicine

## 2021-08-17 ENCOUNTER — Ambulatory Visit: Payer: 59 | Admitting: Family Medicine

## 2021-08-17 ENCOUNTER — Other Ambulatory Visit: Payer: Self-pay | Admitting: Family Medicine

## 2021-08-17 VITALS — BP 114/66 | HR 99 | Temp 97.5°F | Resp 16 | Wt 140.6 lb

## 2021-08-17 DIAGNOSIS — M25519 Pain in unspecified shoulder: Secondary | ICD-10-CM | POA: Diagnosis not present

## 2021-08-17 DIAGNOSIS — Z1231 Encounter for screening mammogram for malignant neoplasm of breast: Secondary | ICD-10-CM

## 2021-08-17 DIAGNOSIS — E782 Mixed hyperlipidemia: Secondary | ICD-10-CM | POA: Diagnosis not present

## 2021-08-17 DIAGNOSIS — R7989 Other specified abnormal findings of blood chemistry: Secondary | ICD-10-CM | POA: Diagnosis not present

## 2021-08-17 DIAGNOSIS — I10 Essential (primary) hypertension: Secondary | ICD-10-CM

## 2021-08-17 DIAGNOSIS — K6389 Other specified diseases of intestine: Secondary | ICD-10-CM

## 2021-08-17 DIAGNOSIS — R739 Hyperglycemia, unspecified: Secondary | ICD-10-CM

## 2021-08-17 DIAGNOSIS — Z114 Encounter for screening for human immunodeficiency virus [HIV]: Secondary | ICD-10-CM

## 2021-08-17 DIAGNOSIS — M542 Cervicalgia: Secondary | ICD-10-CM

## 2021-08-17 DIAGNOSIS — Z1159 Encounter for screening for other viral diseases: Secondary | ICD-10-CM

## 2021-08-17 DIAGNOSIS — D509 Iron deficiency anemia, unspecified: Secondary | ICD-10-CM

## 2021-08-17 LAB — HEMOGLOBIN A1C: Hgb A1c MFr Bld: 5.9 % (ref 4.6–6.5)

## 2021-08-17 LAB — CBC
HCT: 38.8 % (ref 36.0–46.0)
Hemoglobin: 12.9 g/dL (ref 12.0–15.0)
MCHC: 33.3 g/dL (ref 30.0–36.0)
MCV: 85.7 fl (ref 78.0–100.0)
Platelets: 298 10*3/uL (ref 150.0–400.0)
RBC: 4.53 Mil/uL (ref 3.87–5.11)
RDW: 14 % (ref 11.5–15.5)
WBC: 7.7 10*3/uL (ref 4.0–10.5)

## 2021-08-17 MED ORDER — TIZANIDINE HCL 2 MG PO TABS
1.0000 mg | ORAL_TABLET | Freq: Two times a day (BID) | ORAL | 1 refills | Status: DC | PRN
  Filled 2021-08-17: qty 30, 8d supply, fill #0

## 2021-08-17 NOTE — Patient Instructions (Addendum)
Shingrix is the new shingles shot, 2 shots over 2-6 months, confirm coverage with insurance and document, then can return here for shots with nurse appt or at pharmacy     Paxlovid or Molnupiravir are the new COVID medication we can give you if you get COVID so make sure you test if you have symptoms because we have to treat by day 5 of symptoms for it to be effective. If you are positive let us know so we can treat. If a home test is negative and your symptoms are persistent get a PCR test. Can check testing locations at [email or web address] If you are positive we will make an appointment with [place] and we will send in Paxlovid if you would like it. Check with your pharmacy before we meet to confirm they have it in stock, if they do not then we can get the prescription at the [facility]

## 2021-08-17 NOTE — Progress Notes (Signed)
Patient ID: Tara Greene, female    DOB: Oct 29, 1971  Age: 50 y.o. MRN: [id number]    Subjective:   Chief Complaint  Patient presents with   Neck Pain    Pt states that this problem started about two weeks ago   Subjective   HPI Tara Greene presents for office visit today for follow up on htn and GERD. She woke up Sunday September 4th with neck pain radiating to her shoulders and arms bilaterally. She states that the pain gets progressively worse as day goes on. She endorses visiting a chiropractor, but she only experienced mild relief. She experiences numbness in her right arm and right UE local to second finger. Denies CP/palp/SOB/HA/congestion/fevers/GI or GU c/o. Taking meds as prescribed. She had covid 2 months ago and experienced very mild symptoms like having a sore throat.   Review of Systems  Constitutional:  Negative for chills, fatigue and fever.  HENT:  Negative for congestion, rhinorrhea, sinus pressure, sinus pain, sore throat and trouble swallowing.   Eyes:  Negative for pain.  Respiratory:  Negative for cough and shortness of breath.   Cardiovascular:  Negative for chest pain, palpitations and leg swelling.  Gastrointestinal:  Negative for abdominal pain, blood in stool, diarrhea, nausea and vomiting.  Genitourinary:  Negative for decreased urine volume, flank pain, frequency, vaginal bleeding and vaginal discharge.  Musculoskeletal:  Positive for neck pain. Negative for back pain.  Neurological:  Positive for numbness (right UE). Negative for headaches.   History Past Medical History:  Diagnosis Date   Abnormal TSH 06/14/2015   Allergy    Anemia    Dehydration 01/27/2017   Dermatitis 10/18/2013   GERD (gastroesophageal reflux disease)    Hyperlipidemia    Hypertension    Other and unspecified hyperlipidemia 10/18/2013   Perimenopause 04/12/2014    She has a past surgical history that includes No past surgeries; Colonoscopy; laparoscopic appendectomy (N/A,  03/11/2021); and laparoscopic appendectomy (N/A, 03/26/2021).   Her family history includes COPD in her father; Cancer (age of onset: 51) in her mother; Colon cancer in her mother; Hyperlipidemia in her mother; Hypertension in her mother.She reports that she has never smoked. She has never used smokeless tobacco. She reports that she does not drink alcohol and does not use drugs.  Current Outpatient Medications on File Prior to Visit  Medication Sig Dispense Refill   amLODipine (NORVASC) 10 MG tablet TAKE 1 TABLET BY MOUTH ONCE DAILY (Patient taking differently: Take 10 mg by mouth daily.) 90 tablet 1   cholecalciferol (VITAMIN D) 1000 units tablet Take 1 tablet (1,000 Units total) by mouth daily.     cyclobenzaprine (FLEXERIL) 10 MG tablet Take 1 tablet (10 mg total) by mouth 2 (two) times daily as needed for muscle spasms. 10 tablet 0   fexofenadine (ALLEGRA) 180 MG tablet Take 1 tablet (180 mg total) by mouth daily. (Patient taking differently: Take 180 mg by mouth daily as needed for allergies.)     Multiple Vitamin (MULTIVITAMIN) tablet Take 1 tablet by mouth daily.     Omega-3 Fatty Acids (FISH OIL) 1000 MG CAPS Take 1,000 mg by mouth daily.     omeprazole (PRILOSEC) 40 MG capsule TAKE 1 CAPSULE (40 MG TOTAL) BY MOUTH DAILY AS NEEDED. (Patient taking differently: Take 40 mg by mouth daily as needed (acid reflux).) 90 capsule 3   triamcinolone cream (KENALOG) 0.1 % Apply 1 application topically 2 (two) times daily. As needed for rash (Patient taking differently: Apply 1  application topically 2 (two) times daily as needed (rash).) 85.2 g 2   No current facility-administered medications on file prior to visit.     Objective:  Objective  Physical Exam Constitutional:      General: She is not in acute distress.    Appearance: Normal appearance. She is not ill-appearing or toxic-appearing.  HENT:     Head: Normocephalic and atraumatic.     Right Ear: Tympanic membrane, ear canal and external  ear normal.     Left Ear: Tympanic membrane, ear canal and external ear normal.     Nose: No congestion or rhinorrhea.  Eyes:     Extraocular Movements: Extraocular movements intact.     Pupils: Pupils are equal, round, and reactive to light.  Cardiovascular:     Rate and Rhythm: Normal rate and regular rhythm.     Pulses: Normal pulses.     Heart sounds: Normal heart sounds. No murmur heard. Pulmonary:     Effort: Pulmonary effort is normal. No respiratory distress.     Breath sounds: Normal breath sounds. No wheezing, rhonchi or rales.  Abdominal:     General: Bowel sounds are normal.     Palpations: Abdomen is soft. There is no mass.     Tenderness: There is no abdominal tenderness. There is no guarding.     Hernia: No hernia is present.  Musculoskeletal:        General: Normal range of motion.     Cervical back: Normal range of motion and neck supple.  Skin:    General: Skin is warm and dry.  Neurological:     Mental Status: She is alert and oriented to person, place, and time.  Psychiatric:        Behavior: Behavior normal.   BP 114/66   Pulse 99   Temp (!) 97.5 F (36.4 C)   Resp 16   Wt 140 lb 9.6 oz (63.8 kg)   LMP 01/08/2016 Comment: Irregular per patient  SpO2 96%   BMI 27.46 kg/m  Wt Readings from Last 3 Encounters:  08/17/21 140 lb 9.6 oz (63.8 kg)  03/26/21 141 lb 6.4 oz (64.1 kg)  03/03/21 138 lb (62.6 kg)     Lab Results  Component Value Date   WBC 7.7 08/17/2021   HGB 12.9 08/17/2021   HCT 38.8 08/17/2021   PLT 298.0 08/17/2021   GLUCOSE 95 08/17/2021   CHOL 256 (H) 08/17/2021   TRIG 143.0 08/17/2021   HDL 59.80 08/17/2021   LDLCALC 168 (H) 08/17/2021   ALT 19 08/17/2021   AST 15 08/17/2021   NA 139 08/17/2021   K 3.5 08/17/2021   CL 101 08/17/2021   CREATININE 0.67 08/17/2021   BUN 12 08/17/2021   CO2 28 08/17/2021   TSH 2.66 08/17/2021   HGBA1C 5.9 08/17/2021    No results found.   Assessment & Plan:  Plan    Meds ordered  this encounter  Medications   tiZANidine (ZANAFLEX) 2 MG tablet    Sig: Take 1/2-2 tablets (1-4 mg total) by mouth 2 (two) times daily as needed for muscle spasms.    Dispense:  30 tablet    Refill:  1    Problem List Items Addressed This Visit     Cecum mass    Patient underwent partial hysterectomy and lap appy and fortunately the mass was not cancerous. She is doing well.       Anemia    Well controlled, no changes to  meds. Encouraged heart healthy diet such as the DASH diet and exercise as tolerated.       Hyperlipidemia    Encourage heart healthy diet such as MIND or DASH diet, increase exercise, avoid trans fats, simple carbohydrates and processed foods, consider a krill or fish or flaxseed oil cap daily.       Relevant Orders   Lipid panel (Completed)   Abnormal TSH    Check labs      HTN (hypertension)   Relevant Orders   CBC (Completed)   Comprehensive metabolic panel (Completed)   TSH (Completed)   Neck pain    Has worsened and been severe enough to take her to the ER this month. A course of steroids helped the pain a great deal. But she still has a significant amount of symptoms in her left arm with some weakness/numbness and as the steroid has worn off pain has returned. Proceed with Xray and MRI due to the severity of her radicular symptoms. Did not tolerate Flexeril but will try a course of Tizanidine to see if she tolerates that and it helps her sleep.       Other Visit Diagnoses     Neck and shoulder pain    -  Primary   Relevant Orders   DG Cervical Spine Complete (Completed)   Hyperglycemia       Relevant Orders   Hemoglobin A1c (Completed)   Encounter for hepatitis C screening test for low risk patient       Relevant Orders   Hepatitis C Antibody (Completed)   Encounter for screening for HIV       Relevant Orders   HIV antibody (with reflex) (Completed)   Encounter for screening mammogram for malignant neoplasm of breast           Follow-up:  Return in about 6 months (around 02/14/2022) for annual exam.  I, Suezanne Jacquet, acting as a scribe for Penni Homans, MD, have documented all relevent documentation on behalf of Penni Homans, MD, as directed by Penni Homans, MD while in the presence of Penni Homans, MD. DO:08/20/21.  I, Mosie Lukes, MD personally performed the services described in this documentation. All medical record entries made by the scribe were at my direction and in my presence. I have reviewed the chart and agree that the record reflects my personal performance and is accurate and complete

## 2021-08-18 ENCOUNTER — Other Ambulatory Visit: Payer: Self-pay | Admitting: Family Medicine

## 2021-08-18 DIAGNOSIS — M542 Cervicalgia: Secondary | ICD-10-CM

## 2021-08-18 LAB — HEPATITIS C ANTIBODY
Hepatitis C Ab: NONREACTIVE
SIGNAL TO CUT-OFF: 0.01 (ref <1.00)

## 2021-08-18 LAB — COMPREHENSIVE METABOLIC PANEL
ALT: 19 U/L (ref 0–35)
AST: 15 U/L (ref 0–37)
Albumin: 4.2 g/dL (ref 3.5–5.2)
Alkaline Phosphatase: 73 U/L (ref 39–117)
BUN: 12 mg/dL (ref 6–23)
CO2: 28 mEq/L (ref 19–32)
Calcium: 9.3 mg/dL (ref 8.4–10.5)
Chloride: 101 mEq/L (ref 96–112)
Creatinine, Ser: 0.67 mg/dL (ref 0.40–1.20)
GFR: 100.36 mL/min (ref >60.00)
Glucose, Bld: 95 mg/dL (ref 70–99)
Potassium: 3.5 mEq/L (ref 3.5–5.1)
Sodium: 139 mEq/L (ref 135–145)
Total Bilirubin: 0.5 mg/dL (ref 0.2–1.2)
Total Protein: 7.2 g/dL (ref 6.0–8.3)

## 2021-08-18 LAB — LIPID PANEL
Cholesterol: 256 mg/dL — ABNORMAL HIGH (ref 0–200)
HDL: 59.8 mg/dL (ref >39.00)
LDL Cholesterol: 168 mg/dL — ABNORMAL HIGH (ref 0–99)
NonHDL: 196.49
Total CHOL/HDL Ratio: 4
Triglycerides: 143 mg/dL (ref 0.0–149.0)
VLDL: 28.6 mg/dL (ref 0.0–40.0)

## 2021-08-18 LAB — HIV ANTIBODY (ROUTINE TESTING W REFLEX): HIV 1&2 Ab, 4th Generation: NONREACTIVE

## 2021-08-18 LAB — TSH: TSH: 2.66 u[IU]/mL (ref 0.35–5.50)

## 2021-08-20 NOTE — Assessment & Plan Note (Signed)
Has worsened and been severe enough to take her to the ER this month. A course of steroids helped the pain a great deal. But she still has a significant amount of symptoms in her left arm with some weakness/numbness and as the steroid has worn off pain has returned. Proceed with Xray and MRI due to the severity of her radicular symptoms. Did not tolerate Flexeril but will try a course of Tizanidine to see if she tolerates that and it helps her sleep.

## 2021-08-20 NOTE — Assessment & Plan Note (Signed)
Well controlled, no changes to meds. Encouraged heart healthy diet such as the DASH diet and exercise as tolerated.  °

## 2021-08-20 NOTE — Assessment & Plan Note (Signed)
Check labs 

## 2021-08-20 NOTE — Assessment & Plan Note (Signed)
Encourage heart healthy diet such as MIND or DASH diet, increase exercise, avoid trans fats, simple carbohydrates and processed foods, consider a krill or fish or flaxseed oil cap daily.  °

## 2021-08-20 NOTE — Assessment & Plan Note (Addendum)
Patient underwent partial hysterectomy and lap appy and fortunately the mass was not cancerous. She is doing well.

## 2021-08-23 ENCOUNTER — Ambulatory Visit: Payer: 59 | Admitting: Family Medicine

## 2021-08-28 ENCOUNTER — Other Ambulatory Visit: Payer: Self-pay

## 2021-08-28 ENCOUNTER — Ambulatory Visit (HOSPITAL_BASED_OUTPATIENT_CLINIC_OR_DEPARTMENT_OTHER)
Admission: RE | Admit: 2021-08-28 | Discharge: 2021-08-28 | Disposition: A | Discharge status: Home / Self Care | Payer: 59 | Source: Ambulatory Visit | Attending: Family Medicine | Admitting: Family Medicine

## 2021-08-28 DIAGNOSIS — M542 Cervicalgia: Secondary | ICD-10-CM | POA: Insufficient documentation

## 2021-08-29 ENCOUNTER — Other Ambulatory Visit: Payer: Self-pay | Admitting: Family Medicine

## 2021-08-29 DIAGNOSIS — M542 Cervicalgia: Secondary | ICD-10-CM

## 2021-09-01 ENCOUNTER — Ambulatory Visit
Admission: RE | Admit: 2021-09-01 | Discharge: 2021-09-01 | Disposition: A | Discharge status: Home / Self Care | Payer: 59 | Source: Ambulatory Visit | Attending: Family Medicine | Admitting: Family Medicine

## 2021-09-01 ENCOUNTER — Other Ambulatory Visit: Payer: Self-pay

## 2021-09-01 DIAGNOSIS — Z1231 Encounter for screening mammogram for malignant neoplasm of breast: Secondary | ICD-10-CM

## 2021-09-07 DIAGNOSIS — M542 Cervicalgia: Secondary | ICD-10-CM | POA: Diagnosis not present

## 2021-09-07 DIAGNOSIS — Z6825 Body mass index (BMI) 25.0-25.9, adult: Secondary | ICD-10-CM | POA: Diagnosis not present

## 2021-09-07 DIAGNOSIS — M5412 Radiculopathy, cervical region: Secondary | ICD-10-CM | POA: Diagnosis not present

## 2021-09-07 DIAGNOSIS — I10 Essential (primary) hypertension: Secondary | ICD-10-CM | POA: Diagnosis not present

## 2021-10-19 ENCOUNTER — Other Ambulatory Visit (HOSPITAL_BASED_OUTPATIENT_CLINIC_OR_DEPARTMENT_OTHER): Payer: Self-pay

## 2021-10-19 ENCOUNTER — Other Ambulatory Visit: Payer: Self-pay | Admitting: Family Medicine

## 2021-10-19 MED ORDER — AMLODIPINE BESYLATE 10 MG PO TABS
ORAL_TABLET | Freq: Every day | ORAL | 1 refills | Status: DC
  Filled 2021-10-19: qty 90, 90d supply, fill #0
  Filled 2022-02-07: qty 90, 90d supply, fill #1

## 2021-10-19 MED FILL — Omeprazole Cap Delayed Release 40 MG: ORAL | 90 days supply | Qty: 90 | Fill #1 | Status: AC

## 2022-02-07 ENCOUNTER — Other Ambulatory Visit (HOSPITAL_BASED_OUTPATIENT_CLINIC_OR_DEPARTMENT_OTHER): Payer: Self-pay

## 2022-02-07 ENCOUNTER — Other Ambulatory Visit: Payer: Self-pay | Admitting: Family Medicine

## 2022-02-07 MED ORDER — OMEPRAZOLE 40 MG PO CPDR
DELAYED_RELEASE_CAPSULE | ORAL | 3 refills | Status: DC
  Filled 2022-02-07: qty 90, 90d supply, fill #0
  Filled 2022-08-21: qty 90, 90d supply, fill #1
  Filled 2023-01-09: qty 90, 90d supply, fill #2

## 2022-03-14 ENCOUNTER — Encounter: Payer: 59 | Admitting: Family Medicine

## 2022-04-05 ENCOUNTER — Ambulatory Visit (HOSPITAL_COMMUNITY)
Admission: EM | Admit: 2022-04-05 | Discharge: 2022-04-05 | Disposition: A | Discharge status: Home / Self Care | Payer: 59 | Attending: Family Medicine | Admitting: Family Medicine

## 2022-04-05 ENCOUNTER — Encounter (HOSPITAL_COMMUNITY): Payer: Self-pay

## 2022-04-05 ENCOUNTER — Other Ambulatory Visit (HOSPITAL_BASED_OUTPATIENT_CLINIC_OR_DEPARTMENT_OTHER): Payer: Self-pay

## 2022-04-05 DIAGNOSIS — M549 Dorsalgia, unspecified: Secondary | ICD-10-CM | POA: Diagnosis not present

## 2022-04-05 MED ORDER — DEXAMETHASONE SODIUM PHOSPHATE 10 MG/ML IJ SOLN
INTRAMUSCULAR | Status: AC
  Filled 2022-04-05: qty 1

## 2022-04-05 MED ORDER — DEXAMETHASONE SODIUM PHOSPHATE 10 MG/ML IJ SOLN
10.0000 mg | Freq: Once | INTRAMUSCULAR | Status: AC
  Administered 2022-04-05: 10 mg via INTRAMUSCULAR

## 2022-04-05 MED ORDER — CYCLOBENZAPRINE HCL 10 MG PO TABS
10.0000 mg | ORAL_TABLET | Freq: Two times a day (BID) | ORAL | 0 refills | Status: DC | PRN
  Filled 2022-04-05: qty 10, 5d supply, fill #0

## 2022-04-05 MED ORDER — PREDNISONE 20 MG PO TABS
40.0000 mg | ORAL_TABLET | Freq: Every day | ORAL | 0 refills | Status: AC
  Filled 2022-04-05: qty 10, 5d supply, fill #0

## 2022-04-05 NOTE — Discharge Instructions (Signed)
You have been given a shot of dexamethasone 10 mg ? ?Take cyclobenzaprine 10 mg--1 every 8 hours as needed for muscle spasm or muscle pain.  This medication can cause sedation or sleepiness ? ?Take prednisone 20 mg--2 daily for 5 days. ? ?You can also take some Tylenol over-the-counter with this medication ? ?Warm compress or an electric heating pad can help the muscle pain to ?

## 2022-04-05 NOTE — ED Provider Notes (Signed)
?[facility] ? ? ? ?CSN: [id number] ?Arrival date & time: 04/05/22  0801 ? ? ?  ? ?History   ?Chief Complaint ?Chief Complaint  ?Patient presents with  ? Back Pain  ? Shoulder Pain  ? ? ?HPI ?Tara RASKE is a 51 y.o. female.  ? ? ?Back Pain ?Shoulder Pain ?Associated symptoms: back pain   ?Here for a history of upper back pain near her neck.  It began on May 7.  It hurts into her right upper arm.  She has had some tingling in her fingers intermittently, and some prior to this. ?She did a bunch of gardening with leaning over on May 6. ? ?She has seen neurosurgery in the past for some cervical radiculopathy she had more on the left ? ? ? ?Past Medical History:  ?Diagnosis Date  ? Abnormal TSH 06/14/2015  ? Allergy   ? Anemia   ? Dehydration 01/27/2017  ? Dermatitis 10/18/2013  ? GERD (gastroesophageal reflux disease)   ? Hyperlipidemia   ? Hypertension   ? Other and unspecified hyperlipidemia 10/18/2013  ? Perimenopause 04/12/2014  ? ? ?Patient Active Problem List  ? Diagnosis Date Noted  ? Family history of colon cancer in mother 11/06/2020  ? Colon cancer screening 11/06/2020  ? Neck pain 08/21/2017  ? HTN (hypertension) 07/10/2016  ? GERD (gastroesophageal reflux disease) 07/10/2016  ? Abnormal TSH 06/14/2015  ? Perimenopause 04/12/2014  ? Eczema 10/18/2013  ? Allergy   ? Anemia 09/15/2012  ? Hyperlipidemia 09/15/2012  ? Encounter for routine gynecological examination 09/27/2011  ? Cecum mass 09/27/2011  ? Preventative health care 09/20/2011  ? ? ?Past Surgical History:  ?Procedure Laterality Date  ? COLONOSCOPY    ? LAPAROSCOPIC APPENDECTOMY N/A 03/11/2021  ? Procedure: APPENDECTOMY LAPAROSCOPIC;  Surgeon: Leighton Ruff, MD;  Location: [facility];  Service: General;  Laterality: N/A;  60 minutes  ? LAPAROSCOPIC APPENDECTOMY N/A 03/26/2021  ? Procedure: APPENDECTOMY LAPAROSCOPIC;  Surgeon: Leighton Ruff, MD;  Location: [facility];  Service: General;  Laterality: N/A;  60 minutes  ? NO PAST SURGERIES    ? ? ?OB  History   ?No obstetric history on file. ?  ? ? ? ?Home Medications   ? ?Prior to Admission medications   ?Medication Sig Start Date End Date Taking? Authorizing Provider  ?predniSONE (DELTASONE) 20 MG tablet Take 2 tablets (40 mg total) by mouth daily with breakfast for 5 days. 04/05/22 04/10/22 Yes Barrett Henle, MD  ?amLODipine (NORVASC) 10 MG tablet TAKE 1 TABLET BY MOUTH ONCE DAILY 10/19/21   Mosie Lukes, MD  ?cholecalciferol (VITAMIN D) 1000 units tablet Take 1 tablet (1,000 Units total) by mouth daily. 04/27/18   Debbrah Alar, NP  ?cyclobenzaprine (FLEXERIL) 10 MG tablet Take 1 tablet (10 mg total) by mouth 2 (two) times daily as needed for muscle spasms. 04/05/22   Barrett Henle, MD  ?fexofenadine (ALLEGRA) 180 MG tablet Take 1 tablet (180 mg total) by mouth daily. ?Patient taking differently: Take 180 mg by mouth daily as needed for allergies. 04/07/14   Mosie Lukes, MD  ?Multiple Vitamin (MULTIVITAMIN) tablet Take 1 tablet by mouth daily.    [provider]  ?Omega-3 Fatty Acids (FISH OIL) 1000 MG CAPS Take 1,000 mg by mouth daily.    [provider]  ?omeprazole (PRILOSEC) 40 MG capsule TAKE 1 CAPSULE (40 MG TOTAL) BY MOUTH DAILY AS NEEDED. 02/07/22 02/07/23  Mosie Lukes, MD  ?triamcinolone cream (KENALOG) 0.1 % Apply  1 application topically 2 (two) times daily. As needed for rash ?Patient taking differently: Apply 1 application topically 2 (two) times daily as needed (rash). 08/24/20   Mosie Lukes, MD  ? ? ?Family History ?Family History  ?Problem Relation Age of Onset  ? Hypertension Mother   ? Hyperlipidemia Mother   ? Cancer Mother 32  ?     colon   ? Colon cancer Mother   ? COPD Father   ? Breast cancer Neg Hx   ? Esophageal cancer Neg Hx   ? Pancreatic cancer Neg Hx   ? Stomach cancer Neg Hx   ? Inflammatory bowel disease Neg Hx   ? Liver disease Neg Hx   ? ? ?Social History ?Social History  ? ?Tobacco Use  ? Smoking status: Never  ? Smokeless tobacco: Never   ?Vaping Use  ? Vaping Use: Never used  ?Substance Use Topics  ? Alcohol use: No  ? Drug use: No  ? ? ? ?Allergies   ?Patient has no known allergies. ? ? ?Review of Systems ?Review of Systems  ?Musculoskeletal:  Positive for back pain.  ? ? ?Physical Exam ?Triage Vital Signs ?ED Triage Vitals  ?Enc Vitals Group  ?   BP 04/05/22 0824 (!) 157/95  ?   Pulse Rate 04/05/22 0824 73  ?   Resp 04/05/22 0824 18  ?   Temp 04/05/22 0824 98.5 ?F (36.9 ?C)  ?   Temp Source 04/05/22 0824 Oral  ?   SpO2 04/05/22 0824 97 %  ?   Weight --   ?   Height --   ?   Head Circumference --   ?   Peak Flow --   ?   Pain Score 04/05/22 0827 10  ?   Pain Loc --   ?   Pain Edu? --   ?   Excl. in [place]? --   ? ?No data found. ? ?Updated Vital Signs ?BP (!) 157/95 (BP Location: Left Arm)   Pulse 73   Temp 98.5 ?F (36.9 ?C) (Oral)   Resp 18   LMP 01/08/2016 Comment: Irregular per patient  SpO2 97%  ? ?Visual Acuity ?Right Eye Distance:   ?Left Eye Distance:   ?Bilateral Distance:   ? ?Right Eye Near:   ?Left Eye Near:    ?Bilateral Near:    ? ?Physical Exam ?Vitals reviewed.  ?Constitutional:   ?   General: She is not in acute distress. ?   Appearance: She is not toxic-appearing.  ?HENT:  ?   Mouth/Throat:  ?   Mouth: Mucous membranes are moist.  ?Cardiovascular:  ?   Rate and Rhythm: Normal rate and regular rhythm.  ?   Heart sounds: No murmur heard. ?Pulmonary:  ?   Breath sounds: Normal breath sounds. No stridor. No wheezing, rhonchi or rales.  ?Musculoskeletal:     ?   General: Tenderness (upper paraspinous tenderness) present.  ?Skin: ?   Coloration: Skin is not jaundiced or pale.  ?Neurological:  ?   General: No focal deficit present.  ?   Mental Status: She is alert and oriented to person, place, and time.  ?Psychiatric:     ?   Behavior: Behavior normal.  ? ? ? ?UC Treatments / Results  ?Labs ?(all labs ordered are listed, but only abnormal results are displayed) ?Labs Reviewed - No data to display ? ?EKG ? ? ?Radiology ?No results  found. ? ?Procedures ?Procedures (including critical care time) ? ?  Medications Ordered in UC ?Medications  ?dexamethasone (DECADRON) injection 10 mg (has no administration in time range)  ? ? ?Initial Impression / Assessment and Plan / UC Course  ?I have reviewed the triage vital signs and the nursing notes. ? ?Pertinent labs & imaging results that were available during my care of the patient were reviewed by me and considered in my medical decision making (see chart for details). ? ?  ? ?She reports that the tx given in the fall helped a lot (steroids and flexeril). I will repeat all that today. ?Final Clinical Impressions(s) / UC Diagnoses  ? ?Final diagnoses:  ?Upper back pain  ? ? ? ?Discharge Instructions   ? ?  ?You have been given a shot of dexamethasone 10 mg ? ?Take cyclobenzaprine 10 mg--1 every 8 hours as needed for muscle spasm or muscle pain.  This medication can cause sedation or sleepiness ? ?Take prednisone 20 mg--2 daily for 5 days. ? ?You can also take some Tylenol over-the-counter with this medication ? ?Warm compress or an electric heating pad can help the muscle pain to ? ? ? ? ?ED Prescriptions   ? ? Medication Sig Dispense Auth. Provider  ? cyclobenzaprine (FLEXERIL) 10 MG tablet Take 1 tablet (10 mg total) by mouth 2 (two) times daily as needed for muscle spasms. 10 tablet Barrett Henle, MD  ? predniSONE (DELTASONE) 20 MG tablet Take 2 tablets (40 mg total) by mouth daily with breakfast for 5 days. 10 tablet Barrett Henle, MD  ? ?  ? ?PDMP not reviewed this encounter. ?  ?Barrett Henle, MD ?04/05/22 [phone] ? ?

## 2022-04-05 NOTE — ED Triage Notes (Signed)
Pt presents with upper back pain by her neck that radiates down into right shoulder X 2 days that is not related to any injury. ?

## 2022-04-21 ENCOUNTER — Ambulatory Visit: Payer: 59 | Admitting: Family Medicine

## 2022-04-21 ENCOUNTER — Ambulatory Visit (HOSPITAL_BASED_OUTPATIENT_CLINIC_OR_DEPARTMENT_OTHER)
Admission: RE | Admit: 2022-04-21 | Discharge: 2022-04-21 | Disposition: A | Discharge status: Home / Self Care | Payer: 59 | Source: Ambulatory Visit | Attending: Family Medicine | Admitting: Family Medicine

## 2022-04-21 ENCOUNTER — Telehealth: Payer: Self-pay

## 2022-04-21 ENCOUNTER — Other Ambulatory Visit (HOSPITAL_BASED_OUTPATIENT_CLINIC_OR_DEPARTMENT_OTHER): Payer: Self-pay

## 2022-04-21 VITALS — BP 139/90 | HR 94 | Temp 98.2°F | Resp 16 | Ht 60.0 in | Wt 142.0 lb

## 2022-04-21 DIAGNOSIS — M25511 Pain in right shoulder: Secondary | ICD-10-CM

## 2022-04-21 DIAGNOSIS — M19011 Primary osteoarthritis, right shoulder: Secondary | ICD-10-CM | POA: Diagnosis not present

## 2022-04-21 MED ORDER — MELOXICAM 15 MG PO TABS
15.0000 mg | ORAL_TABLET | Freq: Every day | ORAL | 0 refills | Status: DC
  Filled 2022-04-21: qty 30, 30d supply, fill #0

## 2022-04-21 NOTE — Telephone Encounter (Signed)
Nurse Assessment Nurse: Claiborne Billings, RN, Kim Date/Time (Eastern Time): 04/21/2022 8:39:50 AM Confirm and document reason for call. If symptomatic, describe symptoms. ---Caller states she has pain in right arm from shoulder to elbow and in her upper back. No injuries that she is aware of. She has had the pain for about 2 wks, went to UC on 5/9 and was given a dexamethasone injection and steroid pack which helped a little but she still has pain. Pain is intermittent but only goes away for 10 to 15 minutes at a time, current pain level is 7-8/10. She occasionally experiences tingling in the second finger on her right hand. Does the patient have any new or worsening symptoms? ---Yes Will a triage be completed? ---Yes Related visit to physician within the last 2 weeks? ---Yes Does the PT have any chronic conditions? (i.e. diabetes, asthma, this includes High risk factors for pregnancy, etc.) ---Yes List chronic conditions. ---HTN, cervical disc disease Is the patient pregnant or possibly pregnant? (Ask all females between the ages of 34-55) ---No Is this a behavioral health or substance abuse call? ---No PLEASE NOTE: All timestamps contained within this report are represented as [place] Standard Time. CONFIDENTIALTY NOTICE: This fax transmission is intended only for the addressee. It contains information that is legally privileged, confidential or otherwise protected from use or disclosure. If you are not the intended recipient, you are strictly prohibited from reviewing, disclosing, copying using or disseminating any of this information or taking any action in reliance on or regarding this information. If you have received this fax in error, please notify us immediately by telephone so that we can arrange for its return to [place]. Phone: [phone], Toll-Free: [phone], Fax: [phone] Page: 2 of 2 Call Id: [id number] Guidelines Guideline Title Affirmed Question Affirmed Notes Nurse Date/Time  Eilene Ghazi Time) Neurologic Deficit Neck pain (and neurologic deficit) Claiborne Billings, RN, Maudie Mercury 04/21/2022 8:44:08 AM Disp. Time Eilene Ghazi Time) Disposition Final User 04/21/2022 8:46:39 AM See HCP within 4 Hours (or PCP triage) Yes Claiborne Billings, RN, Max Sane Disagree/Comply Comply Caller Understands Yes PreDisposition Did not know what to do Care Advice Given Per Guideline SEE HCP (OR PCP TRIAGE) WITHIN 4 HOURS: * IF OFFICE WILL BE OPEN: You need to be seen within the next 3 or 4 hours. Call your doctor (or NP/PA) now or as soon as the office opens. CALL BACK IF: * You become worse CARE ADVICE given per Neurologic Deficit (Adult) guideline. Comments User: Suezanne Jacquet, RN Date/Time Eilene Ghazi Time): 04/21/2022 8:45:42 AM Current neck pain is 3-4/10 Referrals REFERRED TO PCP OFFICE

## 2022-04-21 NOTE — Telephone Encounter (Signed)
Appt scheduled w/ Taylor 

## 2022-04-21 NOTE — Progress Notes (Signed)
Acute Office Visit  Subjective:     Patient ID: Tara Greene, female    DOB: November 09, 1971, 51 y.o.   MRN: [id number]  Chief Complaint  Patient presents with   Shoulder Pain    Here for right shoulder pain    Shoulder Pain  The pain is present in the right shoulder (feels deep in her shoulder and radiates to upper back and down arm). This is a new problem. The current episode started 1 to 4 weeks ago. The problem has been gradually worsening. The quality of the pain is described as sharp. The pain is at a severity of 9/10. The pain is severe. Pertinent negatives include no fever, joint swelling, limited range of motion, numbness, stiffness or tingling. Treatments tried: Dexamethasone injection and 5 days of prednisone. The treatment provided mild relief.  Pain is constant, not dependent on specific movements/triggers. She has had some right index finger numbness/tingling at times, but this has been chronic and was worked up last year. Nothing new. No current neck pain.  Initial pain started a couple days after doing a lot of gardening, so she thinks she may have overdone it, but cannot recall a specific injury. She denies any weakness or loss of ROM.     Review of systems All review of systems negative except what is listed in the HPI      Objective:    BP 139/90 (BP Location: Left Arm, Patient Position: Sitting, Cuff Size: Normal)   Pulse 94   Temp 98.2 F (36.8 C) (Oral)   Resp 16   Ht 5' (1.524 m)   Wt 142 lb (64.4 kg)   LMP 01/08/2016 Comment: Irregular per patient  SpO2 100%   BMI 27.73 kg/m    Physical Exam Vitals reviewed.  Constitutional:      General: She is not in acute distress.    Appearance: Normal appearance. She is not ill-appearing.  Musculoskeletal:        General: No swelling or tenderness. Normal range of motion.     Comments: Right shoulder - full ROM, no weakness, negative Hawkins, Neer's, Apley's scratch test, no tenderness to shoulder  palpation. Mild right upper trap tension on palpation   Skin:    Findings: No bruising or erythema.  Neurological:     General: No focal deficit present.     Mental Status: She is alert and oriented to person, place, and time. Mental status is at baseline.     Motor: No weakness.     Coordination: Coordination normal.  Psychiatric:        Mood and Affect: Mood normal.        Behavior: Behavior normal.        Thought Content: Thought content normal.        Judgment: Judgment normal.    No results found for any visits on 04/21/22.      Assessment & Plan:   1. Acute pain of right shoulder Since you have already finished with steroids, let's start taking Meloxicam daily for the next 4 weeks or so (do not mix with any other antiinflammatories like ibuprofen or Aleve). You can add occasional tylenol for breakthrough pain.  Rest, ice, heat, home exercises (handout provided).  Xray today given the severity of your pain and will refer to sports medicine to see if any additional imaging may be needed.   - DG Shoulder Right; Future - meloxicam (MOBIC) 15 MG tablet; Take 1 tablet (15 mg total) by mouth  daily.  Dispense: 30 tablet; Refill: 0 - Ambulatory referral to Sports Medicine   Return if symptoms worsen or fail to improve.  Terrilyn Saver, NP

## 2022-04-21 NOTE — Patient Instructions (Addendum)
Right shoulder pain: Since you have already finished with steroids, let's start taking Meloxicam daily for the next 4 weeks or so (do not mix with any other antiinflammatories like ibuprofen or Aleve). You can add occasional tylenol for breakthrough pain.  Rest, ice, heat, home exercises (handout provided).  Xray today given the severity of your pain and will refer to sports medicine to see if any additional imaging may be needed.   Please contact office for follow-up if symptoms do not improve or worsen. Seek emergency care if symptoms become severe.

## 2022-04-22 ENCOUNTER — Encounter: Payer: Self-pay | Admitting: Family Medicine

## 2022-04-27 ENCOUNTER — Other Ambulatory Visit: Payer: Self-pay | Admitting: General Surgery

## 2022-04-27 DIAGNOSIS — Z09 Encounter for follow-up examination after completed treatment for conditions other than malignant neoplasm: Secondary | ICD-10-CM

## 2022-05-06 ENCOUNTER — Ambulatory Visit: Payer: 59

## 2022-05-06 NOTE — Progress Notes (Deleted)
Pt here for #2 Shingles per orders   Pt tolerated well  given in Left deltoid

## 2022-05-09 ENCOUNTER — Ambulatory Visit: Payer: 59 | Admitting: Family Medicine

## 2022-05-24 ENCOUNTER — Ambulatory Visit
Admission: RE | Admit: 2022-05-24 | Discharge: 2022-05-24 | Disposition: A | Discharge status: Home / Self Care | Payer: 59 | Source: Ambulatory Visit | Attending: General Surgery | Admitting: General Surgery

## 2022-05-24 DIAGNOSIS — K388 Other specified diseases of appendix: Secondary | ICD-10-CM | POA: Diagnosis not present

## 2022-05-24 DIAGNOSIS — Z09 Encounter for follow-up examination after completed treatment for conditions other than malignant neoplasm: Secondary | ICD-10-CM

## 2022-05-24 MED ORDER — IOPAMIDOL (ISOVUE-300) INJECTION 61%
100.0000 mL | Freq: Once | INTRAVENOUS | Status: AC | PRN
  Administered 2022-05-24: 100 mL via INTRAVENOUS

## 2022-06-08 IMAGING — MG MM DIGITAL SCREENING BILAT W/ TOMO AND CAD
8 series · 9 of 24 positions shown · non-contrast
Comparison: Previous exam(s).

CLINICAL DATA: Screening.

EXAM:
DIGITAL SCREENING BILATERAL MAMMOGRAM WITH TOMOSYNTHESIS AND CAD
TECHNIQUE: Bilateral screening digital craniocaudal and mediolateral oblique
mammograms were obtained. Bilateral screening digital breast
tomosynthesis was performed. The images were evaluated with
computer-aided detection.

[R MLO synth-2D]
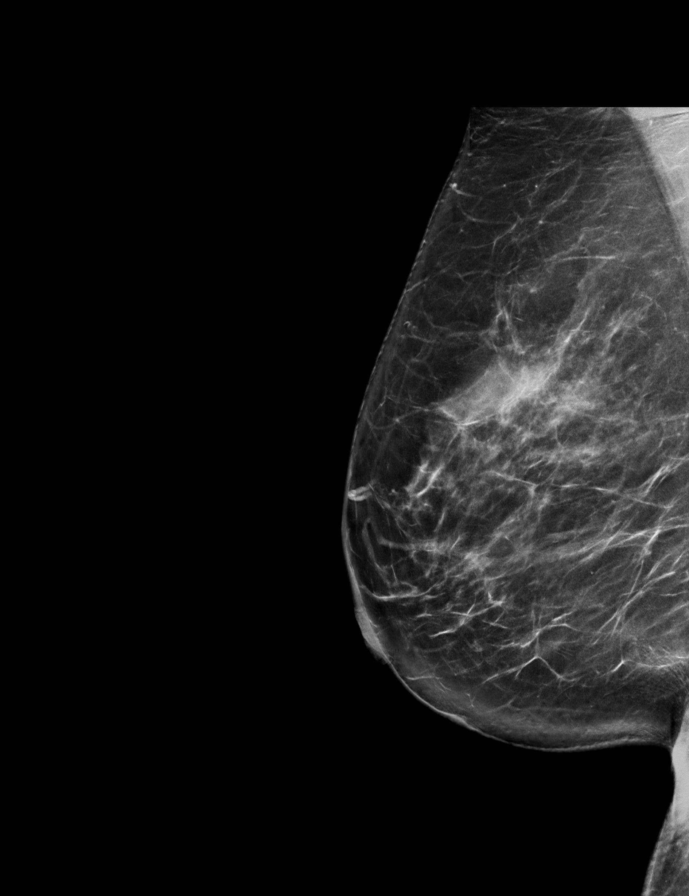

[L CC synth-2D]
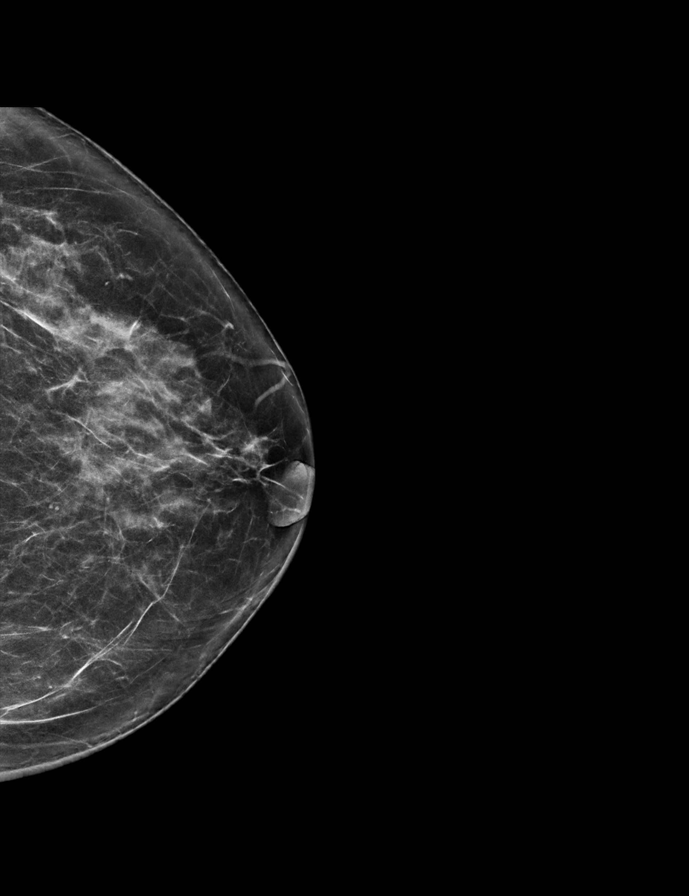

[R CC synth-2D]
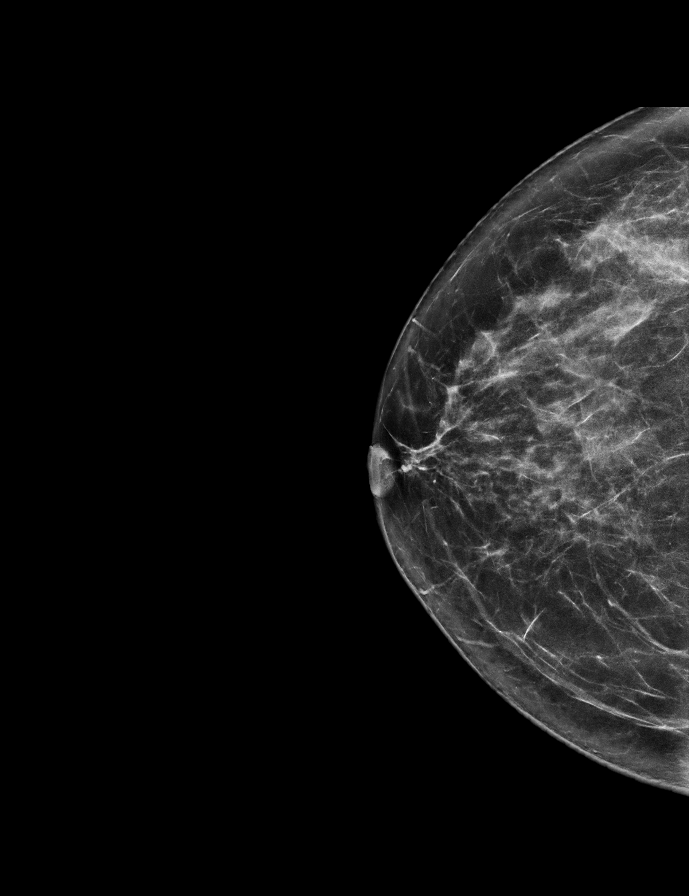

[L MLO synth-2D]
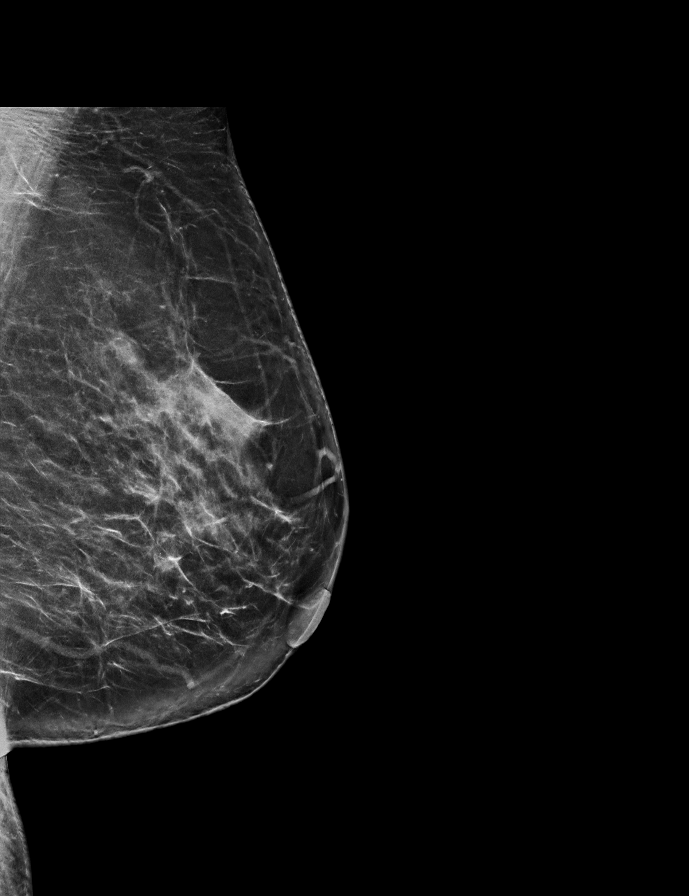

[L MLO tomo · 2 of 72 frames shown]
[frame 24/72]
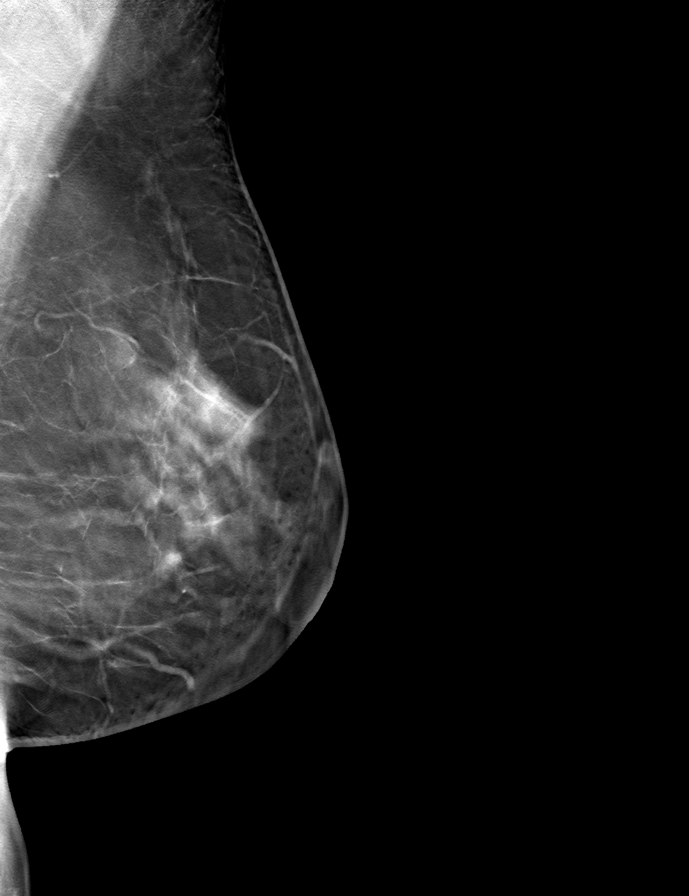
[frame 37/72]
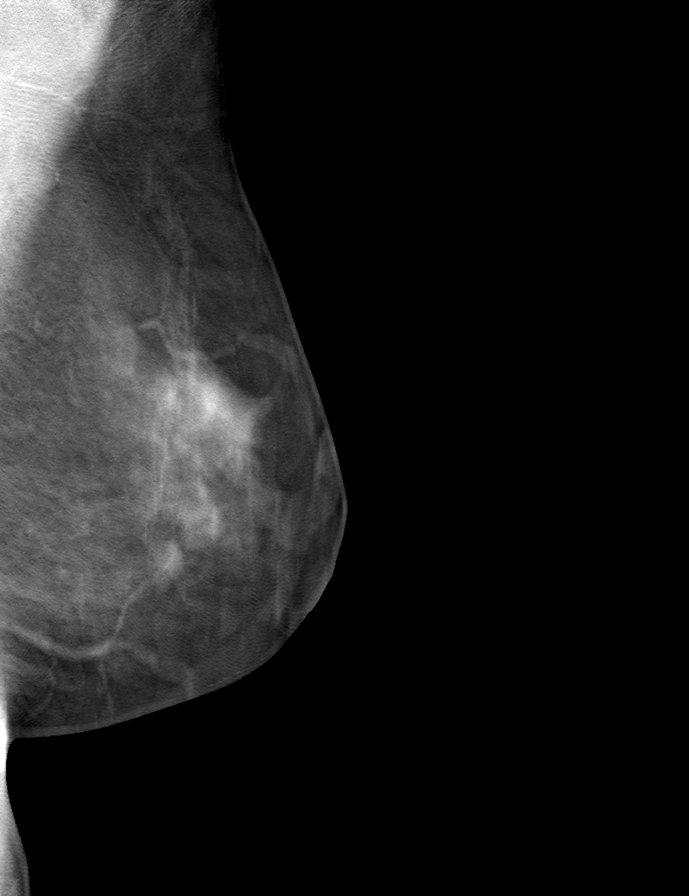

[L CC tomo · tomo slice 33/64.0]
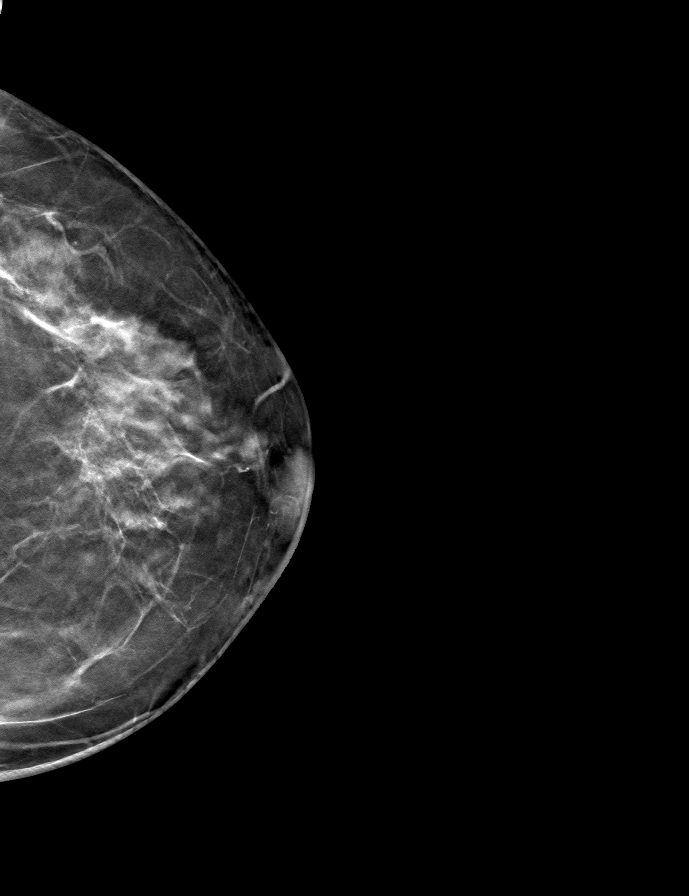

[R CC tomo · tomo slice 33/64.0]
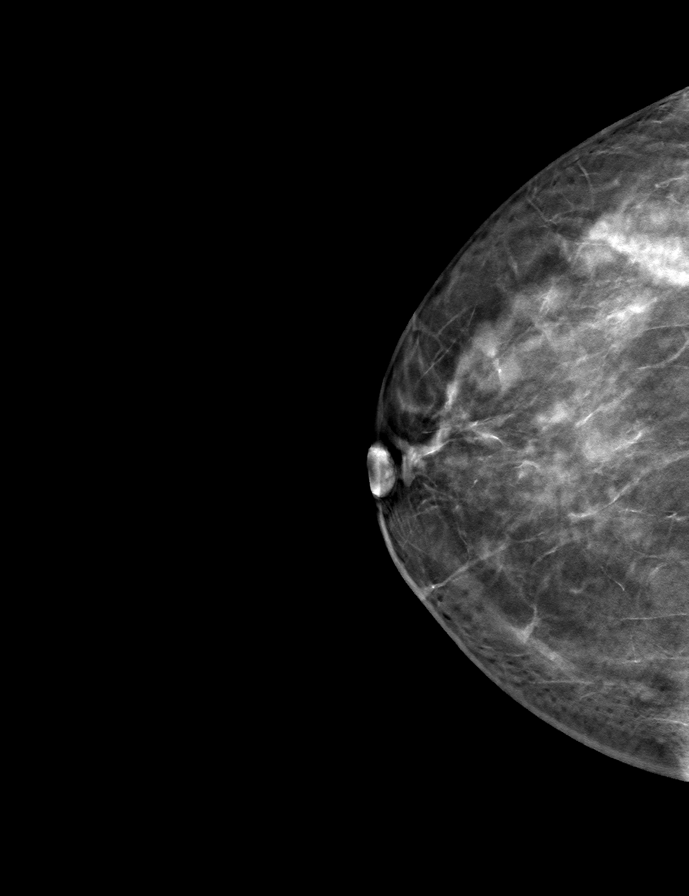

[R MLO tomo · tomo slice 39/77.0]
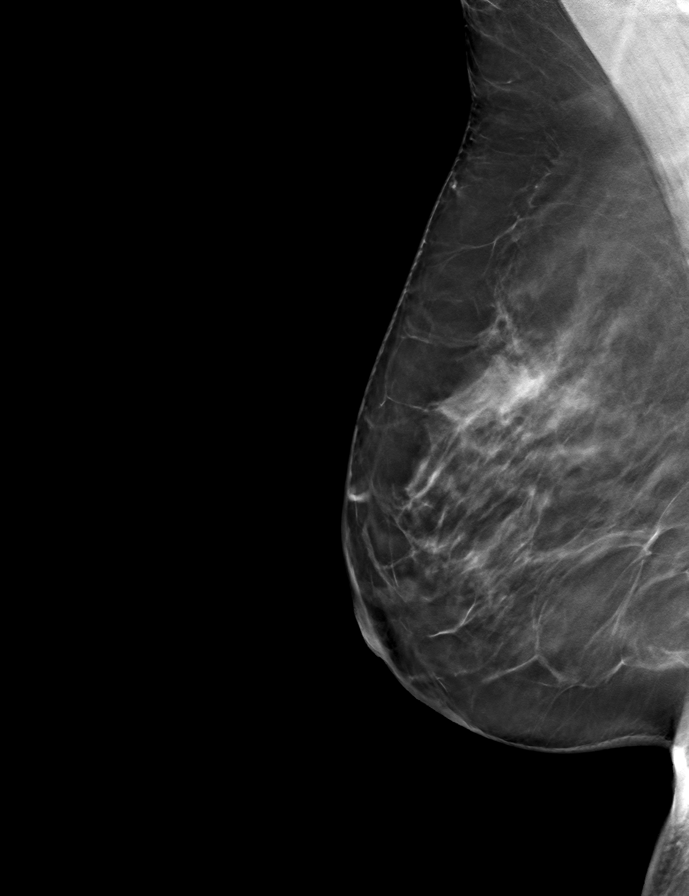

[9 of 24 positions shown; findings below may reference images not displayed]

ACR Breast Density Category c: The breast tissue is heterogeneously
dense, which may obscure small masses.
FINDINGS: There are no findings suspicious for malignancy.
IMPRESSION: No mammographic evidence of malignancy. A result letter of this
screening mammogram will be mailed directly to the patient.

RECOMMENDATION:
Screening mammogram in one year. (Code:Q3-W-BC3)

BI-RADS CATEGORY  1: Negative.

## 2022-06-13 ENCOUNTER — Ambulatory Visit: Payer: 59 | Admitting: Family Medicine

## 2022-06-15 NOTE — Progress Notes (Deleted)
Subjective:    Patient ID: Tara Greene, female    DOB: 06-03-1971, 51 y.o.   MRN: [id number]  No chief complaint on file.   HPI Patient is in today for a physical.  Past Medical History:  Diagnosis Date   Abnormal TSH 06/14/2015   Allergy    Anemia    Dehydration 01/27/2017   Dermatitis 10/18/2013   GERD (gastroesophageal reflux disease)    Hyperlipidemia    Hypertension    Other and unspecified hyperlipidemia 10/18/2013   Perimenopause 04/12/2014    Past Surgical History:  Procedure Laterality Date   COLONOSCOPY     LAPAROSCOPIC APPENDECTOMY N/A 03/11/2021   Procedure: APPENDECTOMY LAPAROSCOPIC;  Surgeon: Leighton Ruff, MD;  Location: [facility];  Service: General;  Laterality: N/A;  60 minutes   LAPAROSCOPIC APPENDECTOMY N/A 03/26/2021   Procedure: APPENDECTOMY LAPAROSCOPIC;  Surgeon: Leighton Ruff, MD;  Location: [facility];  Service: General;  Laterality: N/A;  60 minutes   NO PAST SURGERIES      Family History  Problem Relation Age of Onset   Hypertension Mother    Hyperlipidemia Mother    Cancer Mother 66       colon    Colon cancer Mother    COPD Father    Breast cancer Neg Hx    Esophageal cancer Neg Hx    Pancreatic cancer Neg Hx    Stomach cancer Neg Hx    Inflammatory bowel disease Neg Hx    Liver disease Neg Hx     Social History   Socioeconomic History   Marital status: Married    Spouse name: Not on file   Number of children: 3   Years of education: Not on file   Highest education level: Not on file  Occupational History   Occupation: RN- [employer]  Tobacco Use   Smoking status: Never   Smokeless tobacco: Never  Vaping Use   Vaping Use: Never used  Substance and Sexual Activity   Alcohol use: No   Drug use: No   Sexual activity: Yes    Comment: husband and 3 daughters, nurse at [employer], 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, no dietary restrictions  Other Topics Concern   Not on file  Social History Narrative   Not on file   Social Determinants of Health   Financial  Resource Strain: Not on file  Food Insecurity: Not on file  Transportation Needs: Not on file  Physical Activity: Not on file  Stress: Not on file  Social Connections: Not on file  Intimate Partner Violence: Not on file    Outpatient Medications Prior to Visit  Medication Sig Dispense Refill   amLODipine (NORVASC) 10 MG tablet TAKE 1 TABLET BY MOUTH ONCE DAILY 90 tablet 1   cholecalciferol (VITAMIN D) 1000 units tablet Take 1 tablet (1,000 Units total) by mouth daily.     cyclobenzaprine (FLEXERIL) 10 MG tablet Take 1 tablet (10 mg total) by mouth 2 (two) times daily as needed for muscle spasms. 10 tablet 0   fexofenadine (ALLEGRA) 180 MG tablet Take 1 tablet (180 mg total) by mouth daily. (Patient taking differently: Take 180 mg by mouth daily as needed for allergies.)     meloxicam (MOBIC) 15 MG tablet Take 1 tablet (15 mg total) by mouth daily. 30 tablet 0   Multiple Vitamin (MULTIVITAMIN) tablet Take 1 tablet by mouth daily.     Omega-3 Fatty Acids (FISH OIL) 1000 MG CAPS Take 1,000 mg by mouth daily.     omeprazole (  PRILOSEC) 40 MG capsule TAKE 1 CAPSULE (40 MG TOTAL) BY MOUTH DAILY AS NEEDED. 90 capsule 3   triamcinolone cream (KENALOG) 0.1 % Apply 1 application topically 2 (two) times daily. As needed for rash (Patient taking differently: Apply 1 application. topically 2 (two) times daily as needed (rash).) 85.2 g 2   No facility-administered medications prior to visit.    No Known Allergies  ROS     Objective:    Physical Exam  LMP 01/08/2016 Comment: Irregular per patient Wt Readings from Last 3 Encounters:  04/21/22 142 lb (64.4 kg)  08/17/21 140 lb 9.6 oz (63.8 kg)  03/26/21 141 lb 6.4 oz (64.1 kg)    Diabetic Foot Exam - Simple   No data filed    Lab Results  Component Value Date   WBC 7.7 08/17/2021   HGB 12.9 08/17/2021   HCT 38.8 08/17/2021   PLT 298.0 08/17/2021   GLUCOSE 95 08/17/2021   CHOL 256 (H) 08/17/2021   TRIG 143.0 08/17/2021   HDL 59.80  08/17/2021   LDLCALC 168 (H) 08/17/2021   ALT 19 08/17/2021   AST 15 08/17/2021   NA 139 08/17/2021   K 3.5 08/17/2021   CL 101 08/17/2021   CREATININE 0.67 08/17/2021   BUN 12 08/17/2021   CO2 28 08/17/2021   TSH 2.66 08/17/2021   HGBA1C 5.9 08/17/2021    Lab Results  Component Value Date   TSH 2.66 08/17/2021   Lab Results  Component Value Date   WBC 7.7 08/17/2021   HGB 12.9 08/17/2021   HCT 38.8 08/17/2021   MCV 85.7 08/17/2021   PLT 298.0 08/17/2021   Lab Results  Component Value Date   NA 139 08/17/2021   K 3.5 08/17/2021   CO2 28 08/17/2021   GLUCOSE 95 08/17/2021   BUN 12 08/17/2021   CREATININE 0.67 08/17/2021   BILITOT 0.5 08/17/2021   ALKPHOS 73 08/17/2021   AST 15 08/17/2021   ALT 19 08/17/2021   PROT 7.2 08/17/2021   ALBUMIN 4.2 08/17/2021   CALCIUM 9.3 08/17/2021   ANIONGAP 11 03/26/2021   GFR 100.36 08/17/2021   Lab Results  Component Value Date   CHOL 256 (H) 08/17/2021   Lab Results  Component Value Date   HDL 59.80 08/17/2021   Lab Results  Component Value Date   LDLCALC 168 (H) 08/17/2021   Lab Results  Component Value Date   TRIG 143.0 08/17/2021   Lab Results  Component Value Date   CHOLHDL 4 08/17/2021   Lab Results  Component Value Date   HGBA1C 5.9 08/17/2021       Assessment & Plan:   COLONOSCOPY: 12/25/2020 MAMMO: 09/01/2021 PAP: 03/2018 PSA: n/a DEXA: n/a   Problem List Items Addressed This Visit   None   I am having Karinna B. Nilan maintain her multivitamin, fexofenadine, cholecalciferol, triamcinolone cream, Fish Oil, amLODipine, omeprazole, cyclobenzaprine, and meloxicam.  No orders of the defined types were placed in this encounter.

## 2022-06-16 ENCOUNTER — Encounter: Payer: 59 | Admitting: Family Medicine

## 2022-06-16 ENCOUNTER — Encounter: Payer: Self-pay | Admitting: Family Medicine

## 2022-06-16 ENCOUNTER — Ambulatory Visit (INDEPENDENT_AMBULATORY_CARE_PROVIDER_SITE_OTHER): Payer: 59 | Admitting: Family Medicine

## 2022-06-16 ENCOUNTER — Other Ambulatory Visit (HOSPITAL_BASED_OUTPATIENT_CLINIC_OR_DEPARTMENT_OTHER): Payer: Self-pay

## 2022-06-16 ENCOUNTER — Telehealth (HOSPITAL_BASED_OUTPATIENT_CLINIC_OR_DEPARTMENT_OTHER): Payer: Self-pay

## 2022-06-16 VITALS — BP 116/78 | HR 79 | Resp 20 | Ht 60.0 in | Wt 141.8 lb

## 2022-06-16 DIAGNOSIS — R7989 Other specified abnormal findings of blood chemistry: Secondary | ICD-10-CM

## 2022-06-16 DIAGNOSIS — Z1239 Encounter for other screening for malignant neoplasm of breast: Secondary | ICD-10-CM | POA: Diagnosis not present

## 2022-06-16 DIAGNOSIS — D509 Iron deficiency anemia, unspecified: Secondary | ICD-10-CM

## 2022-06-16 DIAGNOSIS — I10 Essential (primary) hypertension: Secondary | ICD-10-CM

## 2022-06-16 DIAGNOSIS — Z0000 Encounter for general adult medical examination without abnormal findings: Secondary | ICD-10-CM

## 2022-06-16 DIAGNOSIS — E782 Mixed hyperlipidemia: Secondary | ICD-10-CM

## 2022-06-16 LAB — TSH: TSH: 1.07 u[IU]/mL (ref 0.35–5.50)

## 2022-06-16 LAB — COMPREHENSIVE METABOLIC PANEL
ALT: 19 U/L (ref 0–35)
AST: 15 U/L (ref 0–37)
Albumin: 4.5 g/dL (ref 3.5–5.2)
Alkaline Phosphatase: 82 U/L (ref 39–117)
BUN: 11 mg/dL (ref 6–23)
CO2: 29 mEq/L (ref 19–32)
Calcium: 9.8 mg/dL (ref 8.4–10.5)
Chloride: 105 mEq/L (ref 96–112)
Creatinine, Ser: 0.75 mg/dL (ref 0.40–1.20)
GFR: 90.89 mL/min (ref >60.00)
Glucose, Bld: 92 mg/dL (ref 70–99)
Potassium: 3.9 mEq/L (ref 3.5–5.1)
Sodium: 143 mEq/L (ref 135–145)
Total Bilirubin: 0.8 mg/dL (ref 0.2–1.2)
Total Protein: 7.2 g/dL (ref 6.0–8.3)

## 2022-06-16 LAB — CBC
HCT: 38.8 % (ref 36.0–46.0)
Hemoglobin: 13 g/dL (ref 12.0–15.0)
MCHC: 33.6 g/dL (ref 30.0–36.0)
MCV: 87.5 fl (ref 78.0–100.0)
Platelets: 345 10*3/uL (ref 150.0–400.0)
RBC: 4.44 Mil/uL (ref 3.87–5.11)
RDW: 13.1 % (ref 11.5–15.5)
WBC: 5.6 10*3/uL (ref 4.0–10.5)

## 2022-06-16 LAB — LIPID PANEL
Cholesterol: 231 mg/dL — ABNORMAL HIGH (ref 0–200)
HDL: 46.9 mg/dL (ref >39.00)
LDL Cholesterol: 154 mg/dL — ABNORMAL HIGH (ref 0–99)
NonHDL: 184.22
Total CHOL/HDL Ratio: 5
Triglycerides: 152 mg/dL — ABNORMAL HIGH (ref 0.0–149.0)
VLDL: 30.4 mg/dL (ref 0.0–40.0)

## 2022-06-16 MED ORDER — AMLODIPINE BESYLATE 10 MG PO TABS
ORAL_TABLET | Freq: Every day | ORAL | 3 refills | Status: DC
  Filled 2022-06-16: qty 90, 90d supply, fill #0
  Filled 2022-09-27: qty 90, 90d supply, fill #1
  Filled 2023-01-09: qty 90, 90d supply, fill #2
  Filled 2023-06-11: qty 90, 90d supply, fill #3

## 2022-06-16 NOTE — Patient Instructions (Addendum)
Yerba Matte tea do not drink  PURE or MIND or DASH or Mediterranean diet 60-80 ounces of fluids 8000 steps a day Shingrix is the new shingles shot, 2 shots over 2-6 months, confirm coverage with insurance and document, then can return here for shots with nurse appt or at pharmacy   6-8 hours of sleep  Magnesium Glycinate 200-400 mg at bedtime L Tryptophan capsules NOW Sleep plus  [employer]   Preventive Care 53-53 Years Old, Female Preventive care refers to lifestyle choices and visits with your health care provider that can promote health and wellness. Preventive care visits are also called wellness exams. What can I expect for my preventive care visit? Counseling Your health care provider may ask you questions about your: Medical history, including: Past medical problems. Family medical history. Pregnancy history. Current health, including: Menstrual cycle. Method of birth control. Emotional well-being. Home life and relationship well-being. Sexual activity and sexual health. Lifestyle, including: Alcohol, nicotine or tobacco, and drug use. Access to firearms. Diet, exercise, and sleep habits. Work and work Statistician. Sunscreen use. Safety issues such as seatbelt and bike helmet use. Physical exam Your health care provider will check your: Height and weight. These may be used to calculate your BMI (body mass index). BMI is a measurement that tells if you are at a healthy weight. Waist circumference. This measures the distance around your waistline. This measurement also tells if you are at a healthy weight and may help predict your risk of certain diseases, such as type 2 diabetes and high blood pressure. Heart rate and blood pressure. Body temperature. Skin for abnormal spots. What immunizations do I need?  Vaccines are usually given at various ages, according to a schedule. Your health care provider will recommend vaccines for you based on your age, medical  history, and lifestyle or other factors, such as travel or where you work. What tests do I need? Screening Your health care provider may recommend screening tests for certain conditions. This may include: Lipid and cholesterol levels. Diabetes screening. This is done by checking your blood sugar (glucose) after you have not eaten for a while (fasting). Pelvic exam and Pap test. Hepatitis B test. Hepatitis C test. HIV (human immunodeficiency virus) test. STI (sexually transmitted infection) testing, if you are at risk. Lung cancer screening. Colorectal cancer screening. Mammogram. Talk with your health care provider about when you should start having regular mammograms. This may depend on whether you have a family history of breast cancer. BRCA-related cancer screening. This may be done if you have a family history of breast, ovarian, tubal, or peritoneal cancers. Bone density scan. This is done to screen for osteoporosis. Talk with your health care provider about your test results, treatment options, and if necessary, the need for more tests. Follow these instructions at home: Eating and drinking  Eat a diet that includes fresh fruits and vegetables, whole grains, lean protein, and low-fat dairy products. Take vitamin and mineral supplements as recommended by your health care provider. Do not drink alcohol if: Your health care provider tells you not to drink. You are pregnant, may be pregnant, or are planning to become pregnant. If you drink alcohol: Limit how much you have to 0-1 drink a day. Know how much alcohol is in your drink. In the [place] one drink equals one 12 oz bottle of beer (355 mL), one 5 oz glass of wine (148 mL), or one 1 oz glass of hard liquor (44 mL). Lifestyle Brush your teeth every morning  and night with fluoride toothpaste. Floss one time each day. Exercise for at least 30 minutes 5 or more days each week. Do not use any products that contain nicotine or tobacco.  These products include cigarettes, chewing tobacco, and vaping devices, such as e-cigarettes. If you need help quitting, ask your health care provider. Do not use drugs. If you are sexually active, practice safe sex. Use a condom or other form of protection to prevent STIs. If you do not wish to become pregnant, use a form of birth control. If you plan to become pregnant, see your health care provider for a prepregnancy visit. Take aspirin only as told by your health care provider. Make sure that you understand how much to take and what form to take. Work with your health care provider to find out whether it is safe and beneficial for you to take aspirin daily. Find healthy ways to manage stress, such as: Meditation, yoga, or listening to music. Journaling. Talking to a trusted person. Spending time with friends and family. Minimize exposure to UV radiation to reduce your risk of skin cancer. Safety Always wear your seat belt while driving or riding in a vehicle. Do not drive: If you have been drinking alcohol. Do not ride with someone who has been drinking. When you are tired or distracted. While texting. If you have been using any mind-altering substances or drugs. Wear a helmet and other protective equipment during sports activities. If you have firearms in your house, make sure you follow all gun safety procedures. Seek help if you have been physically or sexually abused. What's next? Visit your health care provider once a year for an annual wellness visit. Ask your health care provider how often you should have your eyes and teeth checked. Stay up to date on all vaccines. This information is not intended to replace advice given to you by your health care provider. Make sure you discuss any questions you have with your health care provider. Document Revised: 05/12/2021 Document Reviewed: 05/12/2021 Elsevier Patient Education  [place].

## 2022-06-16 NOTE — Progress Notes (Signed)
Subjective:   By signing my name below, I, Kellie Simmering, attest that this documentation has been prepared under the direction and in the presence of Mosie Lukes, MD 06/16/2022.    Patient ID: Tara Greene, female    DOB: 11/19/1971, 51 y.o.   MRN: [id number]  Chief Complaint  Patient presents with   Annual Exam   HPI Patient is in today for a comprehensive physical exam.  She denies having any fever, ear pain, new muscle pain, joint pain, new moles, congestion, sinus pain, sore throat, chest pain, palpitations, wheezing, nausea, vomitting, diarrhea, constipation, blood in stool, dysuria, frequency and hematuria at this time.   Refills: She is requesting a refill on her Amlodipine 10 mg.   Family history: She reports no changes in her family history. She has a family history of colon cancer. Immunizations: She receives a flu vaccination annually. She is due for shingles and tetanus immunizations. She has been informed about receiving a COVID-19 booster in the fall.  Exercise: Diet: She reports that alcohol and coffee are not regular components of her diet. She has been informed about DASH, mediterranean and PURE diets.  Dental: She is UTD on dental care.  Sleep: She reports that she sometimes works at night which disturbs her sleep. She states that she occasionally takes Melatonin to better manage her sleep. She has been informed about Magnesium Glycinate to aid her sleep.  Past Medical History:  Diagnosis Date   Abnormal TSH 06/14/2015   Allergy    Anemia    Dehydration 01/27/2017   Dermatitis 10/18/2013   GERD (gastroesophageal reflux disease)    Hyperlipidemia    Hypertension    Other and unspecified hyperlipidemia 10/18/2013   Perimenopause 04/12/2014    Past Surgical History:  Procedure Laterality Date   COLONOSCOPY     LAPAROSCOPIC APPENDECTOMY N/A 03/11/2021   Procedure: APPENDECTOMY LAPAROSCOPIC;  Surgeon: Leighton Ruff, MD;  Location: [facility];  Service:  General;  Laterality: N/A;  60 minutes   LAPAROSCOPIC APPENDECTOMY N/A 03/26/2021   Procedure: APPENDECTOMY LAPAROSCOPIC;  Surgeon: Leighton Ruff, MD;  Location: [facility];  Service: General;  Laterality: N/A;  60 minutes   NO PAST SURGERIES      Family History  Problem Relation Age of Onset   Hypertension Mother    Hyperlipidemia Mother    Cancer Mother 17       colon    Colon cancer Mother    COPD Father    Breast cancer Neg Hx    Esophageal cancer Neg Hx    Pancreatic cancer Neg Hx    Stomach cancer Neg Hx    Inflammatory bowel disease Neg Hx    Liver disease Neg Hx     Social History   Socioeconomic History   Marital status: Married    Spouse name: Not on file   Number of children: 3   Years of education: Not on file   Highest education level: Not on file  Occupational History   Occupation: RN- [employer]  Tobacco Use   Smoking status: Never   Smokeless tobacco: Never  Vaping Use   Vaping Use: Never used  Substance and Sexual Activity   Alcohol use: No   Drug use: No   Sexual activity: Yes    Comment: husband and 3 daughters, nurse at [employer], 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     fexofenadine (ALLEGRA) 180 MG tablet Take 1 tablet (180 mg total) by mouth daily. (Patient taking differently: Take 180 mg by mouth daily as needed for allergies.)     Multiple Vitamin (MULTIVITAMIN) tablet Take 1 tablet by mouth daily.     Omega-3 Fatty Acids (FISH OIL) 1000 MG CAPS Take  1,000 mg by mouth daily.     omeprazole (PRILOSEC) 40 MG capsule TAKE 1 CAPSULE (40 MG TOTAL) BY MOUTH DAILY AS NEEDED. 90 capsule 3   triamcinolone cream (KENALOG) 0.1 % Apply 1 application topically 2 (two) times daily. As needed for rash (Patient taking differently: Apply 1 application  topically 2 (two) times daily as needed (rash).) 85.2 g 2   amLODipine (NORVASC) 10 MG tablet TAKE 1 TABLET BY MOUTH ONCE DAILY 90 tablet 1   cyclobenzaprine (FLEXERIL) 10 MG tablet Take 1 tablet (10 mg total) by mouth 2 (two) times daily as needed for muscle spasms. 10 tablet 0   meloxicam (MOBIC) 15 MG tablet Take 1 tablet (15 mg total) by mouth daily. 30 tablet 0   No facility-administered medications prior to visit.    No Known Allergies  Review of Systems  Constitutional:  Negative for fever.  HENT:  Negative for congestion, sinus pain and sore throat.   Respiratory:  Negative for wheezing.   Cardiovascular:  Negative for chest pain and palpitations.  Gastrointestinal:  Negative for abdominal pain, blood in stool, constipation, diarrhea, nausea and vomiting.  Genitourinary:  Negative for dysuria, frequency, hematuria and urgency.  Musculoskeletal:  Negative for joint pain and myalgias.  Skin:        (-) new moles       Objective:    Physical Exam Constitutional:      General: She is not in acute distress.    Appearance: Normal appearance. She is not ill-appearing.  HENT:     Head: Normocephalic and atraumatic.     Right Ear: Ear canal and external ear normal.     Left Ear: Ear canal and external ear normal.  Eyes:     Extraocular Movements: Extraocular movements intact.     Right eye: No nystagmus.     Left eye: No nystagmus.     Pupils: Pupils are equal, round, and reactive to light.     Comments: Slight redness of right eardrum   Cardiovascular:     Rate and Rhythm: Normal rate and regular rhythm.     Pulses: Normal pulses.     Heart sounds: Normal heart sounds. No murmur heard.     No gallop.     Comments: (-) pedal edema Pulmonary:     Effort: Pulmonary effort is normal. No respiratory distress.     Breath sounds: Normal breath sounds. No wheezing or rales.  Abdominal:     General: Bowel sounds are normal.     Palpations: Abdomen is soft.     Tenderness: There is no abdominal tenderness. There is no guarding.  Musculoskeletal:     Comments: Muscle strength 5/5 on upper and lower  extremities  Skin:    General: Skin is warm and dry.  Neurological:     Mental Status: She is alert and oriented to person, place, and time.     Deep Tendon Reflexes:     Reflex Scores:      Patellar reflexes are 2+ on the right side and 2+ on the left side. Psychiatric:        Mood and Affect: Mood normal.        Behavior: Behavior normal.        Judgment: Judgment normal.     BP 116/78 (BP Location: Left Arm, Patient Position: Sitting, Cuff Size: Normal)   Pulse 79   Resp 20   Ht 5' (1.524 m)   Wt 141 lb 12.8 oz (64.3 kg)   LMP 01/08/2016 Comment: Irregular per patient  SpO2 98%   BMI 27.69 kg/m  Wt Readings from Last 3 Encounters:  06/16/22 141 lb 12.8 oz (64.3 kg)  04/21/22 142 lb (64.4 kg)  08/17/21 140 lb 9.6 oz (63.8 kg)    Diabetic Foot Exam - Simple   No data filed    Lab Results  Component Value Date   WBC 5.6 06/16/2022   HGB 13.0 06/16/2022   HCT 38.8 06/16/2022   PLT 345.0 06/16/2022   GLUCOSE 92 06/16/2022   CHOL 231 (H) 06/16/2022   TRIG 152.0 (H) 06/16/2022   HDL 46.90 06/16/2022   LDLCALC 154 (H) 06/16/2022   ALT 19 06/16/2022   AST 15 06/16/2022   NA 143 06/16/2022   K 3.9 06/16/2022   CL 105 06/16/2022   CREATININE 0.75 06/16/2022   BUN 11 06/16/2022   CO2 29 06/16/2022   TSH 1.07 06/16/2022   HGBA1C 5.9 08/17/2021    Lab Results  Component Value Date   TSH 1.07 06/16/2022   Lab Results  Component Value Date   WBC 5.6 06/16/2022   HGB 13.0 06/16/2022   HCT 38.8 06/16/2022   MCV 87.5 06/16/2022   PLT 345.0 06/16/2022    Lab Results  Component Value Date   NA 143 06/16/2022   K 3.9 06/16/2022   CO2 29 06/16/2022   GLUCOSE 92 06/16/2022   BUN 11 06/16/2022   CREATININE 0.75 06/16/2022   BILITOT 0.8 06/16/2022   ALKPHOS 82 06/16/2022   AST 15 06/16/2022   ALT 19 06/16/2022   PROT 7.2 06/16/2022   ALBUMIN 4.5 06/16/2022   CALCIUM 9.8 06/16/2022   ANIONGAP 11 03/26/2021   GFR 90.89 06/16/2022   Lab Results  Component Value Date   CHOL 231 (H) 06/16/2022   Lab Results  Component Value Date   HDL 46.90 06/16/2022   Lab Results  Component Value Date   LDLCALC 154 (H) 06/16/2022   Lab Results  Component Value Date   TRIG 152.0 (H) 06/16/2022   Lab Results  Component Value Date   CHOLHDL 5 06/16/2022   Lab Results  Component Value Date   HGBA1C 5.9 08/17/2021   Colonoscopy: Last completed on 12/25/2020. Normal results. Repeat in 5 years. Pap Smear: Last completed on 04/27/2018. Normal results. Due. She will perform one next year. Mammogram: Last completed on 09/01/2021. No mammographic evidence of malignancy. Repeat in 1-2 years. Ordered to be taken after 09/02/2022.      Assessment & Plan:   Problem List Items Addressed This Visit     Preventative health care - Primary   Anemia   Relevant Orders   CBC (Completed)   Hyperlipidemia  Encourage heart healthy diet such as MIND or DASH diet, increase exercise, avoid trans fats, simple carbohydrates and processed foods, consider a krill or fish or flaxseed oil cap daily.       Relevant Medications   amLODipine (NORVASC) 10 MG tablet   Other Relevant Orders   TSH (Completed)   Lipid panel (Completed)   Abnormal TSH   HTN (hypertension)    Well controlled, no changes to meds. Encouraged heart healthy diet such as the DASH diet and exercise as tolerated.       Relevant Medications   amLODipine (NORVASC) 10 MG tablet   Other Relevant Orders   Comprehensive metabolic panel (Completed)   CBC (Completed)   Other Visit  Diagnoses     Encounter for screening for malignant neoplasm of breast, unspecified screening modality       Relevant Orders   MM 3D SCREEN BREAST BILATERAL      Meds ordered this encounter  Medications   amLODipine (NORVASC) 10 MG tablet    Sig: TAKE 1 TABLET BY MOUTH ONCE DAILY    Dispense:  90 tablet    Refill:  3    I, Penni Homans, MD, personally preformed the services described in this documentation.  All medical record entries made by the scribe were at my direction and in my presence.  I have reviewed the chart and discharge instructions (if applicable) and agree that the record reflects my personal performance and is accurate and complete. 06/16/2022.  I,Mohammed Iqbal,acting as a scribe for Penni Homans, MD.,have documented all relevant documentation on the behalf of Penni Homans, MD,as directed by  Penni Homans, MD while in the presence of Penni Homans, MD.  Penni Homans, MD

## 2022-06-17 NOTE — Assessment & Plan Note (Signed)
Encourage heart healthy diet such as MIND or DASH diet, increase exercise, avoid trans fats, simple carbohydrates and processed foods, consider a krill or fish or flaxseed oil cap daily.  °

## 2022-06-17 NOTE — Assessment & Plan Note (Signed)
Patient encouraged to maintain heart healthy diet, regular exercise, adequate sleep. Consider daily probiotics. Take medications as prescribed. Labs ordered and reviewed Colonoscopy: Last completed on 12/25/2020. Normal results. Repeat in 5 years. Pap Smear: Last completed on 04/27/2018. Normal results. Due. She will perform one next year. Mammogram: Last completed on 09/01/2021. No mammographic evidence of malignancy. Repeat in 1-2 years. Ordered to be taken after 09/02/2022.

## 2022-06-17 NOTE — Assessment & Plan Note (Signed)
Well controlled, no changes to meds. Encouraged heart healthy diet such as the DASH diet and exercise as tolerated.  °

## 2022-08-22 ENCOUNTER — Other Ambulatory Visit (HOSPITAL_BASED_OUTPATIENT_CLINIC_OR_DEPARTMENT_OTHER): Payer: Self-pay

## 2022-09-27 ENCOUNTER — Other Ambulatory Visit (HOSPITAL_BASED_OUTPATIENT_CLINIC_OR_DEPARTMENT_OTHER): Payer: Self-pay

## 2023-01-10 ENCOUNTER — Other Ambulatory Visit (HOSPITAL_BASED_OUTPATIENT_CLINIC_OR_DEPARTMENT_OTHER): Payer: Self-pay

## 2023-01-10 ENCOUNTER — Telehealth: Payer: Self-pay | Admitting: Family Medicine

## 2023-01-10 NOTE — Telephone Encounter (Signed)
Patient would like to come in to get a shingles shot. Is this ok?

## 2023-01-10 NOTE — Telephone Encounter (Signed)
Sent pt mychart message regarding  Shingles to call our office to make appt.

## 2023-01-12 ENCOUNTER — Ambulatory Visit (INDEPENDENT_AMBULATORY_CARE_PROVIDER_SITE_OTHER): Payer: No Typology Code available for payment source

## 2023-01-12 DIAGNOSIS — Z23 Encounter for immunization: Secondary | ICD-10-CM

## 2023-01-12 NOTE — Progress Notes (Signed)
Tara Greene is a 52 y.o. female presents to the office today for Shingles injections, per physician's orders. Original order: Dr.Blyth said yes for shingles so please Give our office a call.The girls up front can get you on nurse visit scheduled for Shingrix shot #1 now and then the second shot in 2-6 months after the first one.  Shingles (med),  (dose),  Left Deltoid (route) was administered + (location) today. Patient tolerated injection.  Patient next injection due: Pt will call back to make appt for next Shingles  [name] M Estuardo Frisbee

## 2023-01-16 ENCOUNTER — Encounter (HOSPITAL_BASED_OUTPATIENT_CLINIC_OR_DEPARTMENT_OTHER): Payer: Self-pay

## 2023-01-16 ENCOUNTER — Ambulatory Visit (HOSPITAL_BASED_OUTPATIENT_CLINIC_OR_DEPARTMENT_OTHER)
Admission: RE | Admit: 2023-01-16 | Discharge: 2023-01-16 | Disposition: A | Discharge status: Home / Self Care | Payer: No Typology Code available for payment source | Source: Ambulatory Visit | Attending: Family Medicine | Admitting: Family Medicine

## 2023-01-16 DIAGNOSIS — Z1239 Encounter for other screening for malignant neoplasm of breast: Secondary | ICD-10-CM

## 2023-01-16 DIAGNOSIS — Z1231 Encounter for screening mammogram for malignant neoplasm of breast: Secondary | ICD-10-CM | POA: Insufficient documentation

## 2023-01-26 IMAGING — DX DG SHOULDER 2+V*R*
3 series · 3 of 3 positions shown · non-contrast
Comparison: None Available.

CLINICAL DATA: Please include true AP (Grashey view), scapular Y,
and axillary views; pain since [REDACTED]

EXAM:
RIGHT SHOULDER - 2+ VIEW

[shoulder grashey]
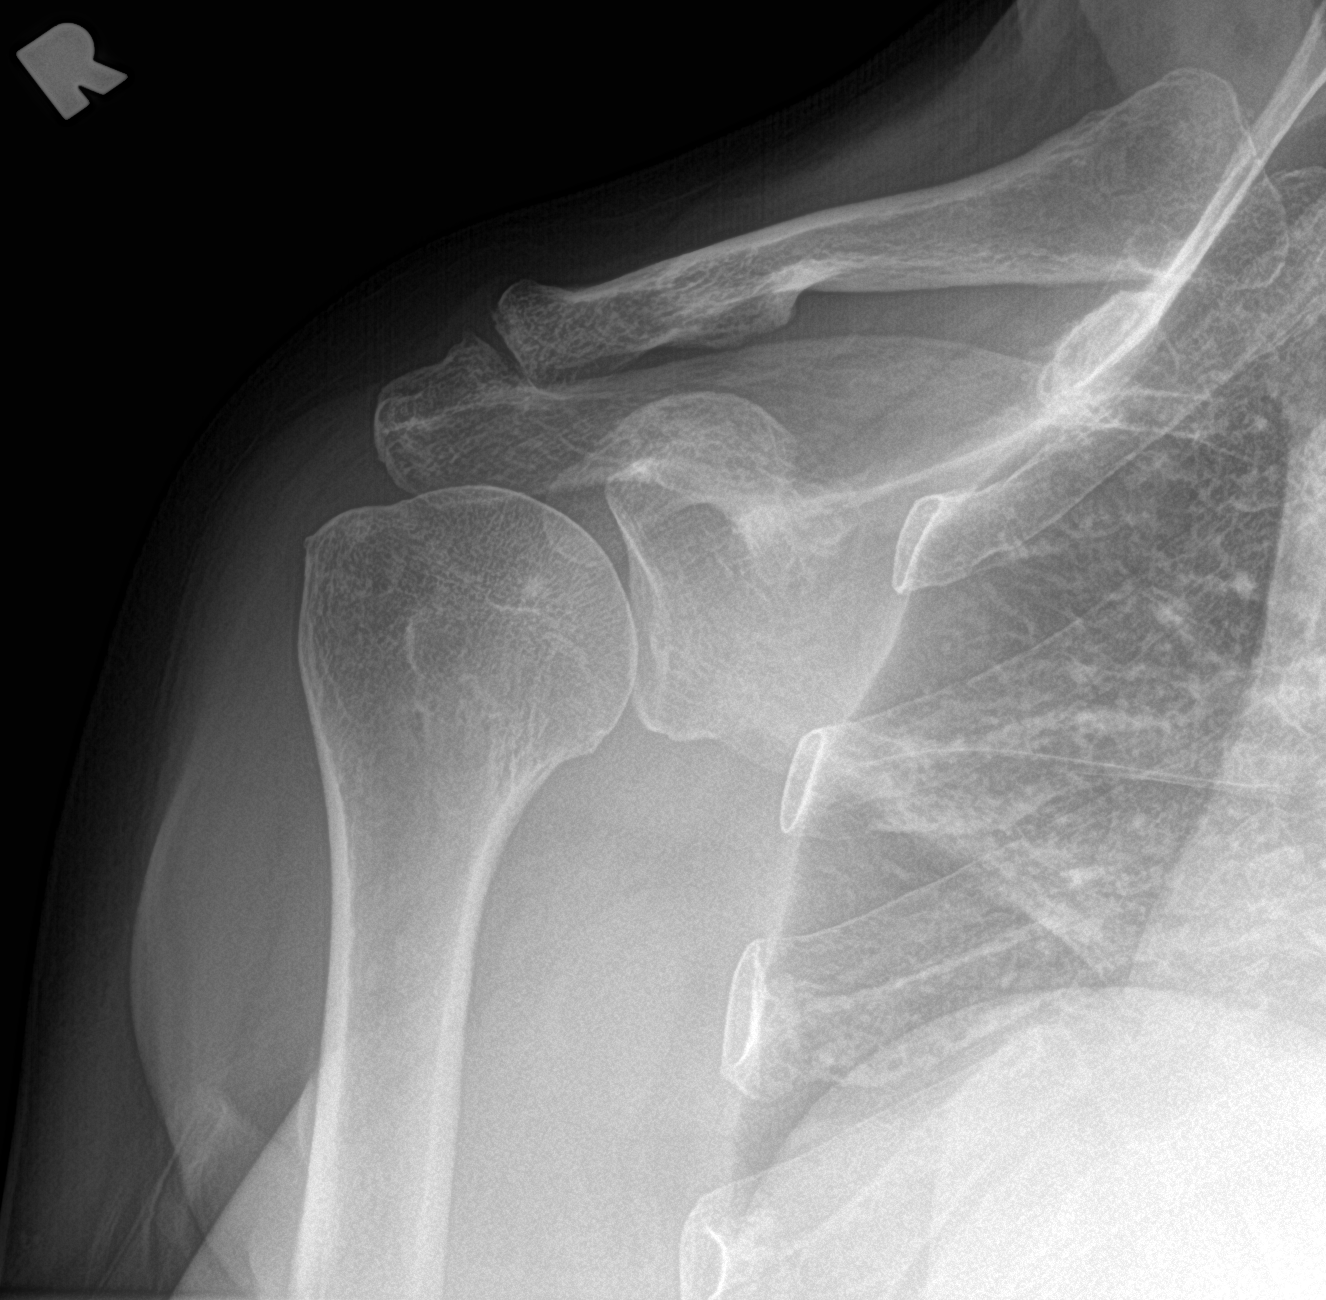

[shoulder axillary]
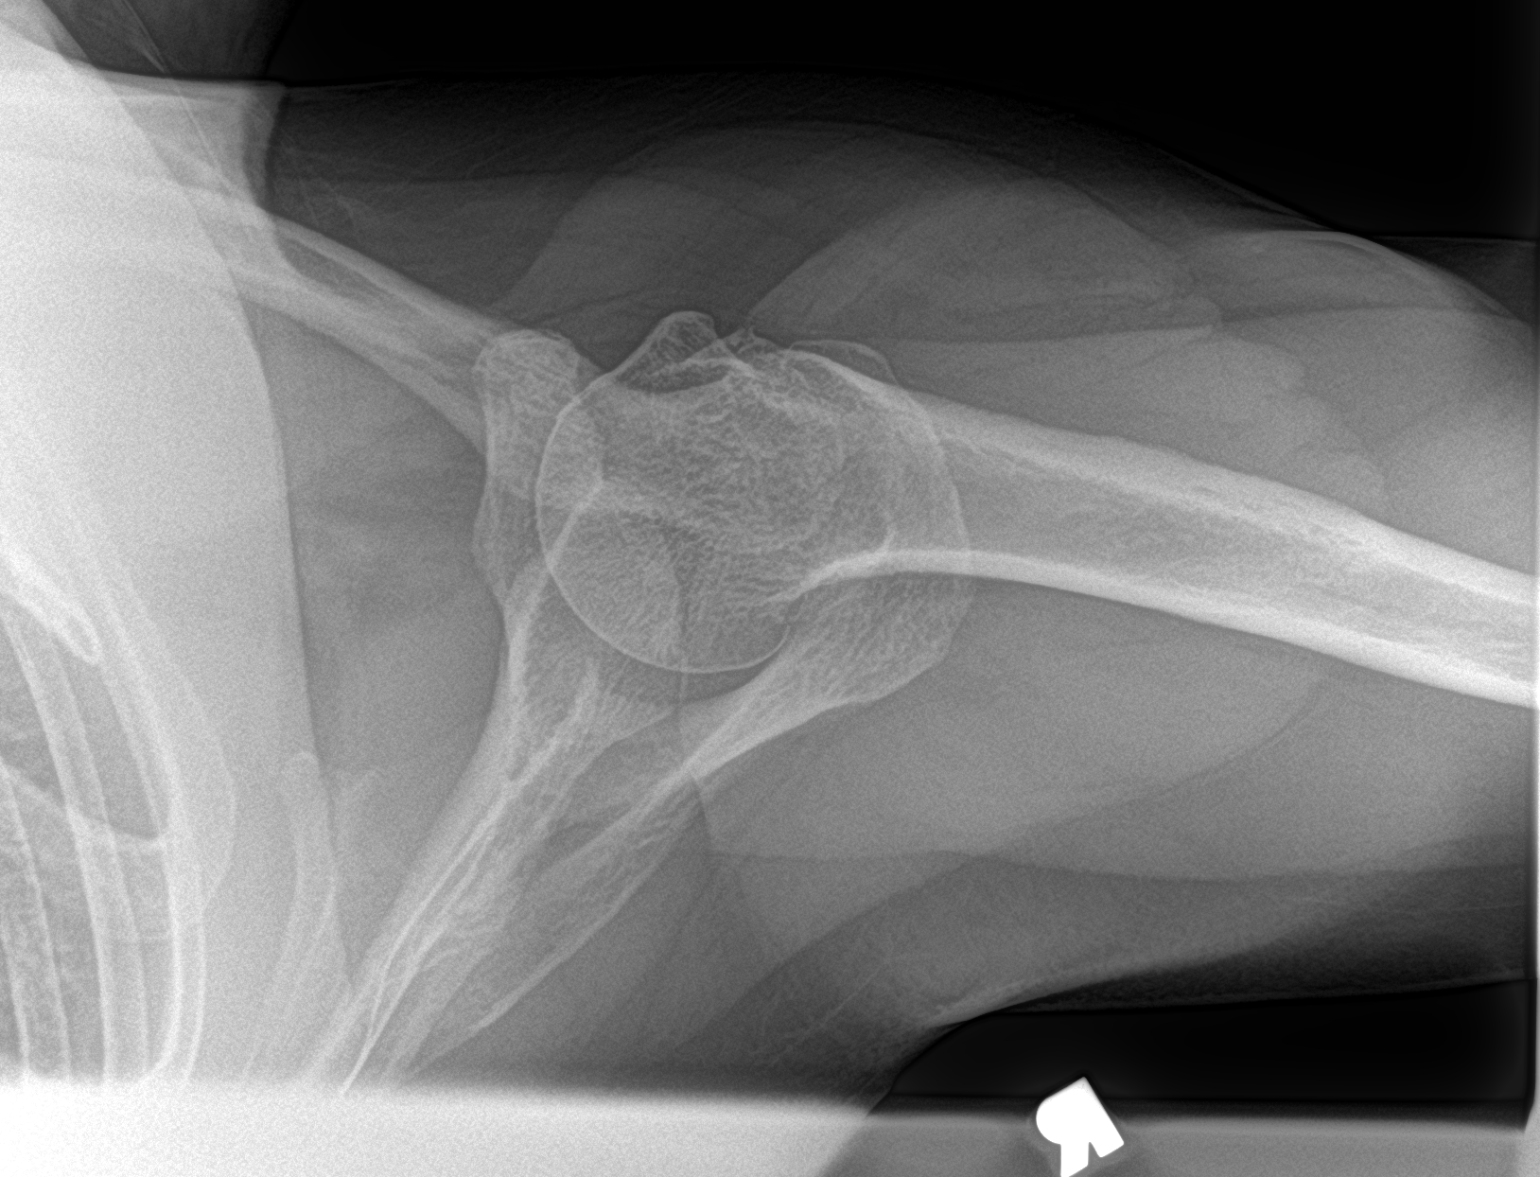

[shoulder y view]
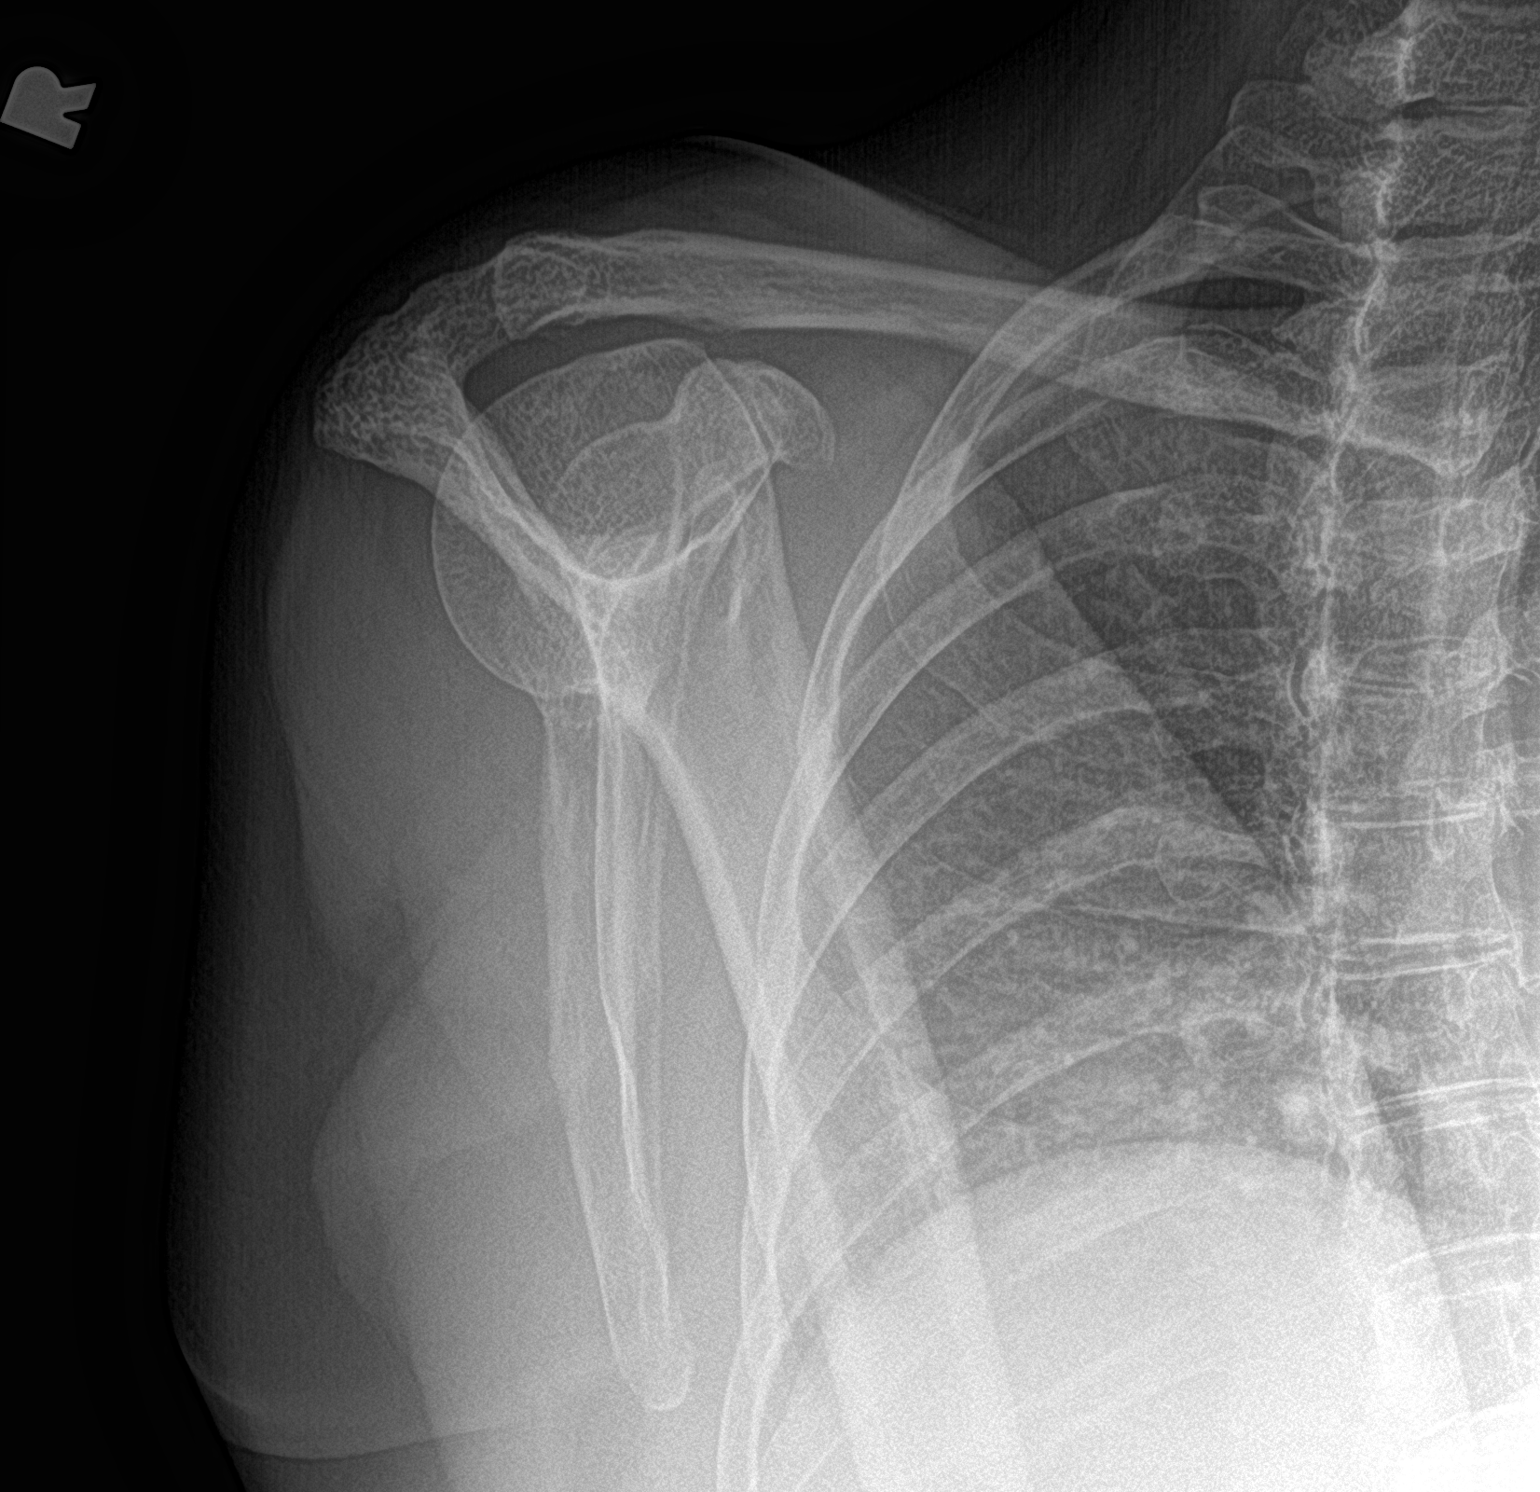

[3 of 3 positions shown; findings below may reference images not displayed]

FINDINGS: No acute fracture or dislocation. Mild degenerative changes of the
acromioclavicular joint. No area of erosion or osseous destruction.
No unexpected radiopaque foreign body. Soft tissues are
unremarkable.
IMPRESSION: No acute fracture or dislocation.

## 2023-03-13 ENCOUNTER — Encounter: Payer: Self-pay | Admitting: *Deleted

## 2023-04-14 ENCOUNTER — Ambulatory Visit (INDEPENDENT_AMBULATORY_CARE_PROVIDER_SITE_OTHER): Payer: No Typology Code available for payment source

## 2023-04-14 DIAGNOSIS — Z23 Encounter for immunization: Secondary | ICD-10-CM | POA: Diagnosis not present

## 2023-04-14 NOTE — Progress Notes (Signed)
Tara Greene is a 52 y.o. female presents to the office today for 2nd Shingles injections, per physician's orders. Original order: Dr.Blyth  Shingrix shot #1 now and then the second shot in 2-6 months after the first one. Shingles (med), (dose), Left Deltoid (route) was administered + (location) today. Patient tolerated injection.

## 2023-06-11 ENCOUNTER — Other Ambulatory Visit: Payer: Self-pay | Admitting: Family Medicine

## 2023-06-12 ENCOUNTER — Other Ambulatory Visit (HOSPITAL_BASED_OUTPATIENT_CLINIC_OR_DEPARTMENT_OTHER): Payer: Self-pay

## 2023-06-12 MED ORDER — OMEPRAZOLE 40 MG PO CPDR
40.0000 mg | DELAYED_RELEASE_CAPSULE | Freq: Every day | ORAL | 3 refills | Status: DC | PRN
  Filled 2023-06-12: qty 90, 90d supply, fill #0
  Filled 2023-09-10: qty 90, 90d supply, fill #1
  Filled 2023-12-25: qty 90, 90d supply, fill #2
  Filled 2024-04-08: qty 90, 90d supply, fill #3

## 2023-09-10 ENCOUNTER — Other Ambulatory Visit: Payer: Self-pay | Admitting: Family Medicine

## 2023-09-11 ENCOUNTER — Other Ambulatory Visit (HOSPITAL_BASED_OUTPATIENT_CLINIC_OR_DEPARTMENT_OTHER): Payer: Self-pay

## 2023-09-11 MED ORDER — AMLODIPINE BESYLATE 10 MG PO TABS
10.0000 mg | ORAL_TABLET | Freq: Every day | ORAL | 0 refills | Status: DC
  Filled 2023-09-11: qty 90, 90d supply, fill #0

## 2023-09-12 ENCOUNTER — Other Ambulatory Visit (HOSPITAL_BASED_OUTPATIENT_CLINIC_OR_DEPARTMENT_OTHER): Payer: Self-pay

## 2023-09-22 ENCOUNTER — Encounter: Payer: No Typology Code available for payment source | Admitting: Family

## 2023-10-05 ENCOUNTER — Ambulatory Visit: Payer: No Typology Code available for payment source | Admitting: Family Medicine

## 2023-12-25 ENCOUNTER — Other Ambulatory Visit: Payer: Self-pay | Admitting: Family Medicine

## 2023-12-26 ENCOUNTER — Other Ambulatory Visit (HOSPITAL_BASED_OUTPATIENT_CLINIC_OR_DEPARTMENT_OTHER): Payer: Self-pay

## 2023-12-26 MED ORDER — AMLODIPINE BESYLATE 10 MG PO TABS
10.0000 mg | ORAL_TABLET | Freq: Every day | ORAL | 0 refills | Status: DC
  Filled 2023-12-26: qty 90, 90d supply, fill #0

## 2023-12-29 ENCOUNTER — Ambulatory Visit: Payer: No Typology Code available for payment source | Admitting: Family

## 2024-01-23 ENCOUNTER — Other Ambulatory Visit (HOSPITAL_BASED_OUTPATIENT_CLINIC_OR_DEPARTMENT_OTHER): Payer: Self-pay | Admitting: Family Medicine

## 2024-01-23 DIAGNOSIS — Z1231 Encounter for screening mammogram for malignant neoplasm of breast: Secondary | ICD-10-CM

## 2024-01-28 DIAGNOSIS — R739 Hyperglycemia, unspecified: Secondary | ICD-10-CM | POA: Insufficient documentation

## 2024-01-28 NOTE — Assessment & Plan Note (Signed)
 Encourage heart healthy diet such as MIND or DASH diet, increase exercise, avoid trans fats, simple carbohydrates and processed foods, consider a krill or fish or flaxseed oil cap daily.

## 2024-01-28 NOTE — Assessment & Plan Note (Signed)
 Well controlled, no changes to meds. Encouraged heart healthy diet such as the DASH diet and exercise as tolerated.

## 2024-01-28 NOTE — Assessment & Plan Note (Signed)
 hgba1c acceptable, minimize simple carbs. Increase exercise as tolerated.

## 2024-01-29 ENCOUNTER — Other Ambulatory Visit: Payer: Self-pay | Admitting: Emergency Medicine

## 2024-01-29 ENCOUNTER — Ambulatory Visit (HOSPITAL_BASED_OUTPATIENT_CLINIC_OR_DEPARTMENT_OTHER)
Admission: RE | Admit: 2024-01-29 | Discharge: 2024-01-29 | Disposition: A | Discharge status: Home / Self Care | Payer: 59 | Source: Ambulatory Visit | Attending: Family Medicine | Admitting: Family Medicine

## 2024-01-29 ENCOUNTER — Encounter (HOSPITAL_BASED_OUTPATIENT_CLINIC_OR_DEPARTMENT_OTHER): Payer: Self-pay

## 2024-01-29 DIAGNOSIS — N6310 Unspecified lump in the right breast, unspecified quadrant: Secondary | ICD-10-CM

## 2024-01-29 DIAGNOSIS — Z1231 Encounter for screening mammogram for malignant neoplasm of breast: Secondary | ICD-10-CM | POA: Insufficient documentation

## 2024-02-01 ENCOUNTER — Other Ambulatory Visit (HOSPITAL_BASED_OUTPATIENT_CLINIC_OR_DEPARTMENT_OTHER): Payer: Self-pay

## 2024-02-01 ENCOUNTER — Ambulatory Visit: Payer: 59 | Admitting: Family Medicine

## 2024-02-01 ENCOUNTER — Encounter: Payer: Self-pay | Admitting: Family Medicine

## 2024-02-01 VITALS — BP 120/74 | HR 86 | Temp 98.2°F | Resp 18 | Ht 60.0 in | Wt 145.8 lb

## 2024-02-01 DIAGNOSIS — R739 Hyperglycemia, unspecified: Secondary | ICD-10-CM

## 2024-02-01 DIAGNOSIS — Z1152 Encounter for screening for COVID-19: Secondary | ICD-10-CM | POA: Diagnosis not present

## 2024-02-01 DIAGNOSIS — R6889 Other general symptoms and signs: Secondary | ICD-10-CM

## 2024-02-01 DIAGNOSIS — L309 Dermatitis, unspecified: Secondary | ICD-10-CM | POA: Diagnosis not present

## 2024-02-01 DIAGNOSIS — I10 Essential (primary) hypertension: Secondary | ICD-10-CM

## 2024-02-01 DIAGNOSIS — E782 Mixed hyperlipidemia: Secondary | ICD-10-CM

## 2024-02-01 LAB — COMPREHENSIVE METABOLIC PANEL
ALT: 19 U/L (ref 0–35)
AST: 18 U/L (ref 0–37)
Albumin: 4.4 g/dL (ref 3.5–5.2)
Alkaline Phosphatase: 91 U/L (ref 39–117)
BUN: 11 mg/dL (ref 6–23)
CO2: 30 meq/L (ref 19–32)
Calcium: 9.5 mg/dL (ref 8.4–10.5)
Chloride: 102 meq/L (ref 96–112)
Creatinine, Ser: 0.68 mg/dL (ref 0.40–1.20)
GFR: 98.29 mL/min (ref >60.00)
Glucose, Bld: 93 mg/dL (ref 70–99)
Potassium: 3.8 meq/L (ref 3.5–5.1)
Sodium: 141 meq/L (ref 135–145)
Total Bilirubin: 0.4 mg/dL (ref 0.2–1.2)
Total Protein: 7.3 g/dL (ref 6.0–8.3)

## 2024-02-01 LAB — CBC WITH DIFFERENTIAL/PLATELET
Basophils Absolute: 0 10*3/uL (ref 0.0–0.1)
Basophils Relative: 0.7 % (ref 0.0–3.0)
Eosinophils Absolute: 0.4 10*3/uL (ref 0.0–0.7)
Eosinophils Relative: 8.6 % — ABNORMAL HIGH (ref 0.0–5.0)
HCT: 41.1 % (ref 36.0–46.0)
Hemoglobin: 14 g/dL (ref 12.0–15.0)
Lymphocytes Relative: 31.5 % (ref 12.0–46.0)
Lymphs Abs: 1.4 10*3/uL (ref 0.7–4.0)
MCHC: 34.1 g/dL (ref 30.0–36.0)
MCV: 86.9 fl (ref 78.0–100.0)
Monocytes Absolute: 0.3 10*3/uL (ref 0.1–1.0)
Monocytes Relative: 7.6 % (ref 3.0–12.0)
Neutro Abs: 2.3 10*3/uL (ref 1.4–7.7)
Neutrophils Relative %: 51.6 % (ref 43.0–77.0)
Platelets: 320 10*3/uL (ref 150.0–400.0)
RBC: 4.73 Mil/uL (ref 3.87–5.11)
RDW: 12.9 % (ref 11.5–15.5)
WBC: 4.5 10*3/uL (ref 4.0–10.5)

## 2024-02-01 LAB — TSH: TSH: 1.95 u[IU]/mL (ref 0.35–5.50)

## 2024-02-01 LAB — POC COVID19 BINAXNOW: SARS Coronavirus 2 Ag: NEGATIVE

## 2024-02-01 LAB — LIPID PANEL
Cholesterol: 253 mg/dL — ABNORMAL HIGH (ref 0–200)
HDL: 45 mg/dL (ref >39.00)
LDL Cholesterol: 166 mg/dL — ABNORMAL HIGH (ref 0–99)
NonHDL: 208.45
Total CHOL/HDL Ratio: 6
Triglycerides: 213 mg/dL — ABNORMAL HIGH (ref 0.0–149.0)
VLDL: 42.6 mg/dL — ABNORMAL HIGH (ref 0.0–40.0)

## 2024-02-01 LAB — HEMOGLOBIN A1C: Hgb A1c MFr Bld: 5.9 % (ref 4.6–6.5)

## 2024-02-01 LAB — POCT INFLUENZA A/B
Influenza A, POC: NEGATIVE
Influenza B, POC: NEGATIVE

## 2024-02-01 MED ORDER — AZITHROMYCIN 250 MG PO TABS
ORAL_TABLET | ORAL | 0 refills | Status: AC
  Filled 2024-02-01: qty 6, 5d supply, fill #0

## 2024-02-01 MED ORDER — HYDROCODONE BIT-HOMATROP MBR 5-1.5 MG/5ML PO SOLN
5.0000 mL | Freq: Four times a day (QID) | ORAL | 0 refills | Status: DC | PRN
  Filled 2024-02-01: qty 120, 6d supply, fill #0

## 2024-02-01 MED ORDER — TRIAMCINOLONE ACETONIDE 0.1 % EX CREA
1.0000 | TOPICAL_CREAM | Freq: Two times a day (BID) | CUTANEOUS | 2 refills | Status: AC | PRN
  Filled 2024-02-01: qty 80, 40d supply, fill #0
  Filled 2024-04-08: qty 80, 40d supply, fill #1
  Filled 2024-08-29: qty 80, 40d supply, fill #2

## 2024-02-01 NOTE — Progress Notes (Signed)
 Subjective:    Patient ID: Tara Greene, female    DOB: 1971-10-13, 53 y.o.   MRN: [id number]  No chief complaint on file.   HPI Discussed the use of AI scribe software for clinical note transcription with the patient, who gave verbal consent to proceed.  History of Present Illness Tara Greene is a 53 year old female who presents with breast soreness and congestion.  She has been experiencing soreness in her right breast for over a year, accompanied by a noticeable lump. The soreness is intermittent and localized to the same spot, with no significant changes in size or tenderness over time. She has a diagnostic imaging appointment scheduled for March 14th. There is no family history of breast cancer, but her mother died of colon cancer at the age of 93.  She developed congestion on Monday after her husband and two daughters fell ill. Symptoms include body aches, a dry cough, and fatigue, but no fever, chills, or shortness of breath. The persistent cough disrupts her sleep. No sore throat, ear pain, or gastrointestinal symptoms are present. Her family has been using Mucinex, which has helped her daughter recover.  Her blood pressure at home is typically below 120/70 mmHg, though it was noted to be borderline during the visit. She monitors it regularly and reports no significant changes.  She is overdue for a tetanus booster, which was due in the summer of the previous year.    Past Medical History:  Diagnosis Date   Abnormal TSH 06/14/2015   Allergy    Anemia    Dehydration 01/27/2017   Dermatitis 10/18/2013   GERD (gastroesophageal reflux disease)    Hyperlipidemia    Hypertension    Other and unspecified hyperlipidemia 10/18/2013   Perimenopause 04/12/2014    Past Surgical History:  Procedure Laterality Date   COLONOSCOPY     LAPAROSCOPIC APPENDECTOMY N/A 03/11/2021   Procedure: APPENDECTOMY LAPAROSCOPIC;  Surgeon: Romie Levee, MD;  Location: [facility];  Service:  General;  Laterality: N/A;  60 minutes   LAPAROSCOPIC APPENDECTOMY N/A 03/26/2021   Procedure: APPENDECTOMY LAPAROSCOPIC;  Surgeon: Romie Levee, MD;  Location: [facility];  Service: General;  Laterality: N/A;  60 minutes   NO PAST SURGERIES      Family History  Problem Relation Age of Onset   Hypertension Mother    Hyperlipidemia Mother    Cancer Mother 77       colon    Colon cancer Mother    COPD Father    Breast cancer Neg Hx    Esophageal cancer Neg Hx    Pancreatic cancer Neg Hx    Stomach cancer Neg Hx    Inflammatory bowel disease Neg Hx    Liver disease Neg Hx     Social History   Socioeconomic History   Marital status: Married    Spouse name: Not on file   Number of children: 3   Years of education: Not on file   Highest education level: Bachelor's degree (e.g., BA, AB, BS)  Occupational History   Occupation: RN- [employer]  Tobacco Use   Smoking status: Never   Smokeless tobacco: Never  Vaping Use   Vaping status: Never Used  Substance and Sexual Activity   Alcohol use: No   Drug use: No   Sexual activity: Yes    Comment: husband and 3 daughters, nurse at [employer], 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, no dietary restrictions  Other Topics Concern   Not on file  Social History Narrative  Not on file   Social Drivers of Health   Financial Resource Strain: Low Risk  (01/26/2024)   Overall Financial Resource Strain (CARDIA)    Difficulty of Paying Living Expenses: Not hard at all  Food Insecurity: No Food Insecurity (01/26/2024)   Hunger Vital Sign    Worried About Running Out of Food in the Last Year: Never true    Ran Out of Food in the Last Year: Never true  Transportation Needs: No Transportation Needs (01/26/2024)   PRAPARE - Administrator, Civil Service (Medical): No    Lack of Transportation (Non-Medical): No  Physical Activity: Insufficiently Active (01/26/2024)   Exercise Vital Sign    Days of Exercise per Week: 2 days    Minutes of Exercise per Session: 30 min  Stress: No  Stress Concern Present (01/26/2024)   [employer] of Occupational Health - Occupational Stress Questionnaire    Feeling of Stress : Only a little  Social Connections: Moderately Isolated (01/26/2024)   Social Connection and Isolation Panel [NHANES]    Frequency of Communication with Friends and Family: Once a week    Frequency of Social Gatherings with Friends and Family: Once a week    Attends Religious Services: More than 4 times per year    Active Member of [employer] or Organizations: No    Attends Engineer, structural: Not on file    Marital Status: Married  Catering manager Violence: Not on file    Outpatient Medications Prior to Visit  Medication Sig Dispense Refill   amLODipine (NORVASC) 10 MG tablet Take 1 tablet (10 mg total) by mouth daily. NEEDS APPT FOR REFILLS 90 tablet 0   cholecalciferol (VITAMIN D) 1000 units tablet Take 1 tablet (1,000 Units total) by mouth daily.     fexofenadine (ALLEGRA) 180 MG tablet Take 1 tablet (180 mg total) by mouth daily. (Patient taking differently: Take 180 mg by mouth daily as needed for allergies.)     Multiple Vitamin (MULTIVITAMIN) tablet Take 1 tablet by mouth daily.     Omega-3 Fatty Acids (FISH OIL) 1000 MG CAPS Take 1,000 mg by mouth daily.     omeprazole (PRILOSEC) 40 MG capsule Take 1 capsule (40 mg total) by mouth daily as needed. 90 capsule 3   triamcinolone cream (KENALOG) 0.1 % Apply 1 application topically 2 (two) times daily. As needed for rash (Patient taking differently: Apply 1 application  topically 2 (two) times daily as needed (rash).) 85.2 g 2   No facility-administered medications prior to visit.    No Known Allergies  Review of Systems  Constitutional:  Negative for fever and malaise/fatigue.  HENT:  Negative for congestion.   Eyes:  Negative for blurred vision.  Respiratory:  Negative for shortness of breath.   Cardiovascular:  Negative for chest pain, palpitations and leg swelling.  Gastrointestinal:   Negative for abdominal pain, blood in stool and nausea.  Genitourinary:  Negative for dysuria and frequency.  Musculoskeletal:  Negative for falls.  Skin:  Negative for rash.  Neurological:  Negative for dizziness, loss of consciousness and headaches.  Endo/Heme/Allergies:  Negative for environmental allergies.  Psychiatric/Behavioral:  Negative for depression. The patient is not nervous/anxious.        Objective:    Physical Exam Constitutional:      General: She is not in acute distress.    Appearance: Normal appearance. She is well-developed. She is not toxic-appearing.  HENT:     Head: Normocephalic and atraumatic.  Right Ear: External ear normal.     Left Ear: External ear normal.     Nose: Nose normal.  Eyes:     General:        Right eye: No discharge.        Left eye: No discharge.     Conjunctiva/sclera: Conjunctivae normal.  Neck:     Thyroid: No thyromegaly.  Cardiovascular:     Rate and Rhythm: Normal rate and regular rhythm.     Heart sounds: Normal heart sounds. No murmur heard. Pulmonary:     Effort: Pulmonary effort is normal. No respiratory distress.     Breath sounds: Normal breath sounds.  Abdominal:     General: Bowel sounds are normal.     Palpations: Abdomen is soft.     Tenderness: There is no abdominal tenderness. There is no guarding.  Musculoskeletal:        General: Normal range of motion.     Cervical back: Neck supple.  Lymphadenopathy:     Cervical: No cervical adenopathy.  Skin:    General: Skin is warm and dry.  Neurological:     Mental Status: She is alert and oriented to person, place, and time.  Psychiatric:        Mood and Affect: Mood normal.        Behavior: Behavior normal.        Thought Content: Thought content normal.        Judgment: Judgment normal.     BP 120/74 (BP Location: Left Arm, Patient Position: Sitting, Cuff Size: Large)   Pulse 86   Temp 98.2 F (36.8 C) (Oral)   Resp 18   Ht 5' (1.524 m)   Wt 145  lb 12.8 oz (66.1 kg)   LMP 01/08/2016 Comment: Irregular per patient  SpO2 98%   BMI 28.47 kg/m  Wt Readings from Last 3 Encounters:  02/01/24 145 lb 12.8 oz (66.1 kg)  06/16/22 141 lb 12.8 oz (64.3 kg)  04/21/22 142 lb (64.4 kg)    Diabetic Foot Exam - Simple   No data filed    Lab Results  Component Value Date   WBC 4.5 02/01/2024   HGB 14.0 02/01/2024   HCT 41.1 02/01/2024   PLT 320.0 02/01/2024   GLUCOSE 93 02/01/2024   CHOL 253 (H) 02/01/2024   TRIG 213.0 (H) 02/01/2024   HDL 45.00 02/01/2024   LDLCALC 166 (H) 02/01/2024   ALT 19 02/01/2024   AST 18 02/01/2024   NA 141 02/01/2024   K 3.8 02/01/2024   CL 102 02/01/2024   CREATININE 0.68 02/01/2024   BUN 11 02/01/2024   CO2 30 02/01/2024   TSH 1.95 02/01/2024   HGBA1C 5.9 02/01/2024    Lab Results  Component Value Date   TSH 1.95 02/01/2024   Lab Results  Component Value Date   WBC 4.5 02/01/2024   HGB 14.0 02/01/2024   HCT 41.1 02/01/2024   MCV 86.9 02/01/2024   PLT 320.0 02/01/2024   Lab Results  Component Value Date   NA 141 02/01/2024   K 3.8 02/01/2024   CO2 30 02/01/2024   GLUCOSE 93 02/01/2024   BUN 11 02/01/2024   CREATININE 0.68 02/01/2024   BILITOT 0.4 02/01/2024   ALKPHOS 91 02/01/2024   AST 18 02/01/2024   ALT 19 02/01/2024   PROT 7.3 02/01/2024   ALBUMIN 4.4 02/01/2024   CALCIUM 9.5 02/01/2024   ANIONGAP 11 03/26/2021   GFR 98.29 02/01/2024   Lab  Results  Component Value Date   CHOL 253 (H) 02/01/2024   Lab Results  Component Value Date   HDL 45.00 02/01/2024   Lab Results  Component Value Date   LDLCALC 166 (H) 02/01/2024   Lab Results  Component Value Date   TRIG 213.0 (H) 02/01/2024   Lab Results  Component Value Date   CHOLHDL 6 02/01/2024   Lab Results  Component Value Date   HGBA1C 5.9 02/01/2024       Assessment & Plan:  Primary hypertension Assessment & Plan: Well controlled, no changes to meds. Encouraged heart healthy diet such as the DASH diet  and exercise as tolerated.   Orders: -     Comprehensive metabolic panel -     CBC with Differential/Platelet -     TSH  Mixed hyperlipidemia Assessment & Plan: Encourage heart healthy diet such as MIND or DASH diet, increase exercise, avoid trans fats, simple carbohydrates and processed foods, consider a krill or fish or flaxseed oil cap daily.   Orders: -     Lipid panel  Hyperglycemia Assessment & Plan: hgba1c acceptable, minimize simple carbs. Increase exercise as tolerated.   Orders: -     Hemoglobin A1c  Flu-like symptoms -     POCT Influenza A/B  Encounter for screening for COVID-19 -     POC COVID-19 BinaxNow  Eczema, unspecified type -     Triamcinolone Acetonide; Apply 1 Application topically 2 (two) times daily as needed for rash.  Dispense: 80 g; Refill: 2  Other orders -     HYDROcodone Bit-Homatrop MBr; Take 5 mLs by mouth every 6 (six) hours as needed for cough.  Dispense: 120 mL; Refill: 0 -     Azithromycin; Take 2 tablets (500 mg total) by mouth on day 1, and then take 1 tablet (250 mg total) by mouth daily on days 2 through 5.  Dispense: 6 tablet; Refill: 0    Assessment and Plan Assessment & Plan Breast Mass Palpable mass in right breast present for over a year with intermittent tenderness. No skin changes or systemic symptoms. Diagnostic mammogram scheduled for 02/09/2024. -Continue with scheduled diagnostic mammogram. -Notify physician of results.  Upper Respiratory Infection Symptoms of dry cough, body aches, and fatigue started on Monday. No fever, shortness of breath, chest pain, or gastrointestinal symptoms. Family members also ill. Throat is red on examination. -Start Flonase and Mucinex. -Consider Hydromet cough syrup at bedtime for symptom relief. -Consider starting Z-Pak if symptoms persist or worsen after one week.  Hypertension Borderline blood pressure in office, but patient reports home readings of 120s/70s. -Continue home blood  pressure monitoring. -Recheck blood pressure in 6 months.  General Health Maintenance -Overdue for Tetanus vaccine. Patient can decide whether to receive it today or at a later date. -Schedule Pap smear for 6 months from now. -Order routine labs including glucose, kidney function, and thyroid function. -Schedule physical exam for one year from now.     Danise Edge, MD

## 2024-02-01 NOTE — Patient Instructions (Signed)

## 2024-02-09 ENCOUNTER — Ambulatory Visit
Admission: RE | Admit: 2024-02-09 | Discharge: 2024-02-09 | Disposition: A | Discharge status: Home / Self Care | Source: Ambulatory Visit | Attending: Family Medicine | Admitting: Family Medicine

## 2024-02-09 DIAGNOSIS — N6310 Unspecified lump in the right breast, unspecified quadrant: Secondary | ICD-10-CM

## 2024-02-09 DIAGNOSIS — R222 Localized swelling, mass and lump, trunk: Secondary | ICD-10-CM | POA: Diagnosis not present

## 2024-02-09 DIAGNOSIS — D171 Benign lipomatous neoplasm of skin and subcutaneous tissue of trunk: Secondary | ICD-10-CM | POA: Diagnosis not present

## 2024-02-29 ENCOUNTER — Encounter: Payer: No Typology Code available for payment source | Admitting: Family Medicine

## 2024-04-08 ENCOUNTER — Other Ambulatory Visit: Payer: Self-pay | Admitting: Family Medicine

## 2024-04-09 ENCOUNTER — Other Ambulatory Visit: Payer: Self-pay

## 2024-04-09 ENCOUNTER — Other Ambulatory Visit (HOSPITAL_BASED_OUTPATIENT_CLINIC_OR_DEPARTMENT_OTHER): Payer: Self-pay

## 2024-04-09 MED ORDER — AMLODIPINE BESYLATE 10 MG PO TABS
10.0000 mg | ORAL_TABLET | Freq: Every day | ORAL | 0 refills | Status: DC
  Filled 2024-04-09: qty 90, 90d supply, fill #0

## 2024-06-19 DIAGNOSIS — L299 Pruritus, unspecified: Secondary | ICD-10-CM | POA: Diagnosis not present

## 2024-06-19 DIAGNOSIS — I872 Venous insufficiency (chronic) (peripheral): Secondary | ICD-10-CM | POA: Diagnosis not present

## 2024-06-19 DIAGNOSIS — I10 Essential (primary) hypertension: Secondary | ICD-10-CM | POA: Diagnosis not present

## 2024-06-19 DIAGNOSIS — R252 Cramp and spasm: Secondary | ICD-10-CM | POA: Diagnosis not present

## 2024-06-19 DIAGNOSIS — I83812 Varicose veins of left lower extremities with pain: Secondary | ICD-10-CM | POA: Diagnosis not present

## 2024-07-03 ENCOUNTER — Other Ambulatory Visit: Payer: Self-pay | Admitting: Family Medicine

## 2024-07-03 DIAGNOSIS — I10 Essential (primary) hypertension: Secondary | ICD-10-CM

## 2024-07-04 ENCOUNTER — Other Ambulatory Visit (HOSPITAL_BASED_OUTPATIENT_CLINIC_OR_DEPARTMENT_OTHER): Payer: Self-pay

## 2024-07-04 MED ORDER — AMLODIPINE BESYLATE 10 MG PO TABS
10.0000 mg | ORAL_TABLET | Freq: Every day | ORAL | 1 refills | Status: AC
  Filled 2024-07-04: qty 90, 90d supply, fill #0
  Filled 2024-10-02: qty 90, 90d supply, fill #1

## 2024-08-05 NOTE — Assessment & Plan Note (Deleted)
 Encourage heart healthy diet such as MIND or DASH diet, increase exercise, avoid trans fats, simple carbohydrates and processed foods, consider a krill or fish or flaxseed oil cap daily.

## 2024-08-05 NOTE — Assessment & Plan Note (Deleted)
 Well controlled, no changes to meds. Encouraged heart healthy diet such as the DASH diet and exercise as tolerated.

## 2024-08-05 NOTE — Assessment & Plan Note (Deleted)
 hgba1c acceptable, minimize simple carbs. Increase exercise as tolerated.

## 2024-08-08 ENCOUNTER — Ambulatory Visit: Admitting: Family Medicine

## 2024-08-08 ENCOUNTER — Other Ambulatory Visit (HOSPITAL_BASED_OUTPATIENT_CLINIC_OR_DEPARTMENT_OTHER): Payer: Self-pay

## 2024-08-08 DIAGNOSIS — E782 Mixed hyperlipidemia: Secondary | ICD-10-CM

## 2024-08-08 DIAGNOSIS — I10 Essential (primary) hypertension: Secondary | ICD-10-CM

## 2024-08-08 DIAGNOSIS — R739 Hyperglycemia, unspecified: Secondary | ICD-10-CM

## 2024-08-08 MED ORDER — FLUZONE 0.5 ML IM SUSY
0.5000 mL | PREFILLED_SYRINGE | Freq: Once | INTRAMUSCULAR | 0 refills | Status: DC
  Filled 2024-08-08: qty 0.5, 1d supply, fill #0

## 2024-08-09 NOTE — Progress Notes (Unsigned)
 Subjective:     Patient ID: Tara Greene, female    DOB: Oct 09, 1971, 53 y.o.   MRN: [id number]  No chief complaint on file.   HPI  Discussed the use of AI scribe software for clinical note transcription with the patient, who gave verbal consent to proceed.   Follows with vein specialist, neurosurgery  Neck and shoulder pain, chronic-follows with neurosurgery???  HTN-amlodipine  10 mg daily Home BP  HLD-OMega 3 fatty acids 1000 mg daily  GERD- omeprazole  40 mg daily,***  Vitamin D  deficiency-vitamin D  1000 units daily?    Patient denies fever, chills, SOB, CP, palpitations, dyspnea, edema, HA, vision changes, N/V/D, abdominal pain, urinary symptoms, rash, weight changes, and recent illness or hospitalizations.   History of Present Illness              Health Maintenance Due  Topic Date Due  . Hepatitis B Vaccines 19-59 Average Risk (1 of 3 - 19+ 3-dose series) Never done  . Pneumococcal Vaccine: 50+ Years (1 of 1 - PCV) Never done  . Cervical Cancer Screening (HPV/Pap Cotest)  04/28/2023  . DTaP/Tdap/Td (2 - Td or Tdap) 06/28/2023  . COVID-19 Vaccine (4 - 2025-26 season) 07/29/2024    Past Medical History:  Diagnosis Date  . Abnormal TSH 06/14/2015  . Allergy   . Anemia   . Dehydration 01/27/2017  . Dermatitis 10/18/2013  . GERD (gastroesophageal reflux disease)   . Hyperlipidemia   . Hypertension   . Other and unspecified hyperlipidemia 10/18/2013  . Perimenopause 04/12/2014    Past Surgical History:  Procedure Laterality Date  . COLONOSCOPY    . LAPAROSCOPIC APPENDECTOMY N/A 03/11/2021   Procedure: APPENDECTOMY LAPAROSCOPIC;  Surgeon: Debby Hila, MD;  Location: [facility];  Service: General;  Laterality: N/A;  60 minutes  . LAPAROSCOPIC APPENDECTOMY N/A 03/26/2021   Procedure: APPENDECTOMY LAPAROSCOPIC;  Surgeon: Debby Hila, MD;  Location: [facility];  Service: General;  Laterality: N/A;  60 minutes  . NO PAST SURGERIES      Family History   Problem Relation Age of Onset  . Hypertension Mother   . Hyperlipidemia Mother   . Cancer Mother 40       colon   . Colon cancer Mother   . COPD Father   . Breast cancer Neg Hx   . Esophageal cancer Neg Hx   . Pancreatic cancer Neg Hx   . Stomach cancer Neg Hx   . Inflammatory bowel disease Neg Hx   . Liver disease Neg Hx     Social History   Socioeconomic History  . Marital status: Married    Spouse name: Not on file  . Number of children: 3  . Years of education: Not on file  . Highest education level: Bachelor's degree (e.g., BA, AB, BS)  Occupational History  . Occupation: RN- [employer]  Tobacco Use  . Smoking status: Never  . Smokeless tobacco: Never  Vaping Use  . Vaping status: Never Used  Substance and Sexual Activity  . Alcohol use: No  . Drug use: No  . 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 Not on file  Social History Narrative  . Not on file   Social Drivers of Health   Financial Resource Strain: Low Risk  (08/03/2024)   Overall Financial Resource Strain (CARDIA)   . Difficulty of Paying Living Expenses: Not hard at all  Food  Insecurity: No Food Insecurity (08/03/2024)   Hunger Vital Sign   . Worried About Programme researcher, broadcasting/film/video in the Last Year: Never true   . Ran Out of Food in the Last Year: Never true  Transportation Needs: No Transportation Needs (08/03/2024)   PRAPARE - Transportation   . Lack of Transportation (Medical): No   . Lack of Transportation (Non-Medical): No  Physical Activity: Insufficiently Active (08/03/2024)   Exercise Vital Sign   . Days of Exercise per Week: 3 days   . Minutes of Exercise per Session: 40 min  Stress: Stress Concern Present (08/03/2024)   [employer] of Occupational Health - Occupational Stress Questionnaire   . Feeling of Stress: To some extent  Social Connections: Moderately Integrated (08/03/2024)   Social Connection and  Isolation Panel   . Frequency of Communication with Friends and Family: More than three times a week   . Frequency of Social Gatherings with Friends and Family: Once a week   . Attends Religious Services: More than 4 times per year   . Active Member of Clubs or Organizations: No   . Attends Banker Meetings: Not on file   . Marital Status: Married  Catering manager Violence: Not on file    Outpatient Medications Prior to Visit  Medication Sig Dispense Refill  . amLODipine  (NORVASC ) 10 MG tablet Take 1 tablet (10 mg total) by mouth daily. 90 tablet 1  . cholecalciferol (VITAMIN D ) 1000 units tablet Take 1 tablet (1,000 Units total) by mouth daily.    . fexofenadine  (ALLEGRA ) 180 MG tablet Take 1 tablet (180 mg total) by mouth daily. (Patient taking differently: Take 180 mg by mouth daily as needed for allergies.)    . HYDROcodone  bit-homatropine (HYDROMET) 5-1.5 MG/5ML syrup Take 5 mLs by mouth every 6 (six) hours as needed for cough. 120 mL 0  . influenza vac split trivalent PF (FLUZONE ) 0.5 ML injection Inject 0.5 mLs into the muscle once for 1 dose. 0.5 mL 0  . Multiple Vitamin (MULTIVITAMIN) tablet Take 1 tablet by mouth daily.    . Omega-3 Fatty Acids (FISH OIL) 1000 MG CAPS Take 1,000 mg by mouth daily.    . omeprazole  (PRILOSEC) 40 MG capsule Take 1 capsule (40 mg total) by mouth daily as needed. 90 capsule 3  . triamcinolone  cream (KENALOG ) 0.1 % Apply 1 Application topically 2 (two) times daily as needed for rash. 80 g 2   No facility-administered medications prior to visit.    No Known Allergies  ROS    See HPI Objective:    Physical Exam Constitutional:      General: She is not in acute distress.    Appearance: She is not ill-appearing, toxic-appearing or diaphoretic.  HENT:     Head: Normocephalic and atraumatic.     Right Ear: Tympanic membrane, ear canal and external ear normal.     Left Ear: Tympanic membrane, ear canal and external ear normal.      Nose: Nose normal. No congestion.     Mouth/Throat:     Mouth: Mucous membranes are moist.     Pharynx: Oropharynx is clear.  Eyes:     Extraocular Movements: Extraocular movements intact.     Right eye: Normal extraocular motion.     Left eye: Normal extraocular motion.     Conjunctiva/sclera: Conjunctivae normal.     Pupils: Pupils are equal, round, and reactive to light.  Neck:     Thyroid : No thyroid  mass or thyromegaly.  Vascular: No carotid bruit or JVD.  Cardiovascular:     Rate and Rhythm: Normal rate and regular rhythm.     Pulses: Normal pulses.          Radial pulses are 2+ on the right side and 2+ on the left side.       Dorsalis pedis pulses are 2+ on the right side and 2+ on the left side.     Heart sounds: Normal heart sounds, S1 normal and S2 normal. No murmur heard.    No friction rub. No gallop.  Pulmonary:     Effort: Pulmonary effort is normal. No respiratory distress.     Breath sounds: Normal breath sounds.  Abdominal:     General: Bowel sounds are normal. There is no distension.     Palpations: Abdomen is soft.     Tenderness: There is no abdominal tenderness. There is no guarding.  Musculoskeletal:        General: Normal range of motion.     Cervical back: Full passive range of motion without pain and normal range of motion. No edema or erythema.     Right lower leg: No edema.     Left lower leg: No edema.  Lymphadenopathy:     Cervical: No cervical adenopathy.  Skin:    General: Skin is warm and dry.     Capillary Refill: Capillary refill takes less than 2 seconds.  Neurological:     General: No focal deficit present.     Mental Status: She is alert and oriented to person, place, and time.     Cranial Nerves: No cranial nerve deficit.     Motor: No weakness.     Coordination: Coordination normal.     Gait: Gait normal.     Deep Tendon Reflexes: Reflexes normal.  Psychiatric:        Mood and Affect: Mood normal.        Behavior: Behavior  normal.        Thought Content: Thought content normal.    Pelvic exam: External genitalia normal. Speculum exam with cervix visualized, no lesions or discharge. Pap smear obtained. Bimanual exam normal- NTWP; adnexa without masses or tenderness. Breast exam: Breasts symmetric, no masses, skin changes, or nipple discharge. No axillary lymphadenopathy.  LMP 01/08/2016 Comment: Irregular per patient Wt Readings from Last 3 Encounters:  02/01/24 145 lb 12.8 oz (66.1 kg)  06/16/22 141 lb 12.8 oz (64.3 kg)  04/21/22 142 lb (64.4 kg)       Assessment & Plan:   Problem List Items Addressed This Visit   None   I am having Tara Greene maintain her multivitamin, fexofenadine , cholecalciferol, Fish Oil, omeprazole , triamcinolone  cream, HYDROcodone  bit-homatropine, amLODipine , and Fluzone .  No orders of the defined types were placed in this encounter.

## 2024-08-13 ENCOUNTER — Other Ambulatory Visit (HOSPITAL_COMMUNITY)
Admission: RE | Admit: 2024-08-13 | Discharge: 2024-08-13 | Disposition: A | Discharge status: Home / Self Care | Source: Ambulatory Visit | Attending: Student | Admitting: Student

## 2024-08-13 ENCOUNTER — Ambulatory Visit: Admitting: Student

## 2024-08-13 ENCOUNTER — Encounter: Payer: Self-pay | Admitting: Student

## 2024-08-13 VITALS — BP 118/76 | HR 76 | Temp 98.3°F | Resp 12 | Ht 60.0 in | Wt 123.8 lb

## 2024-08-13 DIAGNOSIS — K219 Gastro-esophageal reflux disease without esophagitis: Secondary | ICD-10-CM | POA: Diagnosis not present

## 2024-08-13 DIAGNOSIS — Z0000 Encounter for general adult medical examination without abnormal findings: Secondary | ICD-10-CM

## 2024-08-13 DIAGNOSIS — E782 Mixed hyperlipidemia: Secondary | ICD-10-CM | POA: Diagnosis not present

## 2024-08-13 DIAGNOSIS — R739 Hyperglycemia, unspecified: Secondary | ICD-10-CM | POA: Diagnosis not present

## 2024-08-13 DIAGNOSIS — Z01419 Encounter for gynecological examination (general) (routine) without abnormal findings: Secondary | ICD-10-CM | POA: Insufficient documentation

## 2024-08-13 DIAGNOSIS — I10 Essential (primary) hypertension: Secondary | ICD-10-CM

## 2024-08-13 NOTE — Assessment & Plan Note (Signed)
 Avoid offending foods, start probiotics. Do not eat large meals in late evening and consider raising head of bed  - Refill omeprazole  prescription.

## 2024-08-13 NOTE — Assessment & Plan Note (Signed)
 Encourage heart healthy diet such as MIND or DASH diet, increase exercise, avoid trans fats, simple carbohydrates and processed foods, consider a krill or fish or flaxseed oil cap daily.     The 10-year ASCVD risk score (Arnett DK, et al., 2019) is: 3.6%   Values used to calculate the score:     Age: 52 years     Clincally relevant sex: Female     Is Non-Hispanic African American: No     Diabetic: No     Tobacco smoker: No     Systolic Blood Pressure: 120 mmHg     Is BP treated: Yes     HDL Cholesterol: 45 mg/dL     Total Cholesterol: 253 mg/dL

## 2024-08-13 NOTE — Assessment & Plan Note (Signed)
 Well controlled, no changes to meds. Encouraged heart healthy diet such as the DASH diet and exercise as tolerated.

## 2024-08-13 NOTE — Assessment & Plan Note (Signed)
 Patient encouraged to maintain heart healthy diet, regular exercise, adequate sleep. Consider daily probiotics. Take medications as prescribed. Labs ordered and reviewed

## 2024-08-15 LAB — CYTOLOGY - PAP
Comment: NEGATIVE
Diagnosis: NEGATIVE
High risk HPV: NEGATIVE

## 2024-08-16 ENCOUNTER — Ambulatory Visit: Payer: Self-pay | Admitting: Student

## 2024-08-29 ENCOUNTER — Other Ambulatory Visit (HOSPITAL_BASED_OUTPATIENT_CLINIC_OR_DEPARTMENT_OTHER): Payer: Self-pay

## 2024-08-29 ENCOUNTER — Other Ambulatory Visit: Payer: Self-pay | Admitting: Family Medicine

## 2024-08-29 MED ORDER — OMEPRAZOLE 40 MG PO CPDR
40.0000 mg | DELAYED_RELEASE_CAPSULE | Freq: Every day | ORAL | 1 refills | Status: AC | PRN
  Filled 2024-08-29: qty 90, 90d supply, fill #0
  Filled 2024-12-19: qty 90, 90d supply, fill #1

## 2024-12-08 ENCOUNTER — Encounter (HOSPITAL_COMMUNITY): Payer: Self-pay

## 2024-12-08 ENCOUNTER — Ambulatory Visit (HOSPITAL_COMMUNITY): Admission: EM | Admit: 2024-12-08 | Discharge: 2024-12-08 | Disposition: A | Discharge status: Home / Self Care

## 2024-12-08 ENCOUNTER — Other Ambulatory Visit (HOSPITAL_COMMUNITY): Payer: Self-pay

## 2024-12-08 ENCOUNTER — Ambulatory Visit (HOSPITAL_COMMUNITY)

## 2024-12-08 DIAGNOSIS — M542 Cervicalgia: Secondary | ICD-10-CM | POA: Diagnosis not present

## 2024-12-08 DIAGNOSIS — M5010 Cervical disc disorder with radiculopathy, unspecified cervical region: Secondary | ICD-10-CM

## 2024-12-08 MED ORDER — KETOROLAC TROMETHAMINE 30 MG/ML IJ SOLN
INTRAMUSCULAR | Status: AC
  Filled 2024-12-08: qty 1

## 2024-12-08 MED ORDER — KETOROLAC TROMETHAMINE 30 MG/ML IJ SOLN
30.0000 mg | Freq: Once | INTRAMUSCULAR | Status: AC
  Administered 2024-12-08: 30 mg via INTRAMUSCULAR

## 2024-12-08 MED ORDER — DEXAMETHASONE SOD PHOSPHATE PF 10 MG/ML IJ SOLN
INTRAMUSCULAR | Status: AC
  Filled 2024-12-08: qty 1

## 2024-12-08 MED ORDER — DEXAMETHASONE SOD PHOSPHATE PF 10 MG/ML IJ SOLN
10.0000 mg | Freq: Once | INTRAMUSCULAR | Status: AC
  Administered 2024-12-08: 10 mg via INTRAMUSCULAR

## 2024-12-08 MED ORDER — LIDOCAINE 5 % EX PTCH
1.0000 | MEDICATED_PATCH | CUTANEOUS | 0 refills | Status: DC
  Filled 2024-12-08 – 2024-12-10 (×2): qty 30, 30d supply, fill #0

## 2024-12-08 MED ORDER — METHOCARBAMOL 500 MG PO TABS
500.0000 mg | ORAL_TABLET | Freq: Two times a day (BID) | ORAL | 0 refills | Status: AC
  Filled 2024-12-08 – 2024-12-10 (×2): qty 20, 10d supply, fill #0

## 2024-12-08 NOTE — ED Provider Notes (Signed)
 " [facility]    CSN: [id number] Arrival date & time: 12/08/24  0802      History   Chief Complaint Chief Complaint  Patient presents with   Neck Pain    HPI Tara Greene is a 54 y.o. female.   Patient presents today with a 4 to 5-day history of neck pain with radiation into her right arm.  She has a history of similar symptoms in the past with last episode over a year ago.  She has never seen a specialist, had an MRI, denies previous spinal surgery.  She did have an x-ray on 08/17/2021 that showed degenerative disc disease throughout lower cervical spine.  She reports that her symptoms began after she used an extra pillow when sleeping and developed some stiffness in her neck.  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  She reports that pain is rated 8/9 on a 0-10 pain scale, localized to her right cervical/trapezius region, radiating into her arm, described as throbbing with periodic shooting pains, no alleviating factors identified.  She has tried Tylenol  and ibuprofen without improvement.  She last had Tylenol  at around 2 AM this morning and had ibuprofen at 9 PM last night.  These medications have been ineffective.  She denies any associated fever, rash, headache, nausea, vomiting.  She does report some numbness and paresthesias in her hand.  She is having difficulty with her daily activities.    Past Medical History:  Diagnosis Date   Abnormal TSH 06/14/2015   Allergy    Anemia    Dehydration 01/27/2017   Dermatitis 10/18/2013   GERD (gastroesophageal reflux disease)    Hyperlipidemia    Hypertension    Other and unspecified hyperlipidemia 10/18/2013   Perimenopause 04/12/2014    Patient Active Problem List   Diagnosis Date Noted   Annual visit for general adult medical examination without abnormal findings 08/13/2024   Family history of colon cancer in mother 11/06/2020    Colon cancer screening 11/06/2020   Neck pain 08/21/2017   HTN (hypertension) 07/10/2016   GERD (gastroesophageal reflux disease) 07/10/2016   Abnormal TSH 06/14/2015   Perimenopause 04/12/2014   Eczema 10/18/2013   Allergy    Anemia 09/15/2012   Hyperlipidemia 09/15/2012   Encounter for routine gynecological examination 09/27/2011   Preventative health care 09/20/2011    Past Surgical History:  Procedure Laterality Date   COLONOSCOPY     LAPAROSCOPIC APPENDECTOMY N/A 03/11/2021   Procedure: APPENDECTOMY LAPAROSCOPIC;  Surgeon: Debby Hila, MD;  Location: [facility];  Service: General;  Laterality: N/A;  60 minutes   LAPAROSCOPIC APPENDECTOMY N/A 03/26/2021   Procedure: APPENDECTOMY LAPAROSCOPIC;  Surgeon: Debby Hila, MD;  Location: [facility];  Service: General;  Laterality: N/A;  60 minutes   NO PAST SURGERIES      OB History   No obstetric history on file.      Home Medications    Prior to Admission medications  Medication Sig Start Date End Date Taking? Authorizing Provider  amLODipine  (NORVASC ) 10 MG tablet Take 1 tablet (10 mg total) by mouth daily. 07/04/24  Yes Domenica Harlene LABOR, MD  cholecalciferol (VITAMIN D ) 1000 units tablet Take 1 tablet (1,000 Units total) by mouth daily. 04/27/18  Yes Daryl Setter, NP  ferrous sulfate (SM IRON) 325 (65 FE) MG tablet    Yes [provider]  fexofenadine  (ALLEGRA ) 180 MG tablet Take 1  tablet (180 mg total) by mouth daily. Patient taking differently: Take 180 mg by mouth daily as needed for allergies. 04/07/14  Yes Domenica Harlene LABOR, MD  lidocaine  (LIDODERM ) 5 % Place 1 patch onto the skin daily. Remove & Discard patch within 12 hours or as directed by MD 12/08/24  Yes Francella Barnett K, PA-C  methocarbamol  (ROBAXIN ) 500 MG tablet Take 1 tablet (500 mg total) by mouth 2 (two) times daily. 12/08/24  Yes Ilene Witcher K, PA-C  Multiple Vitamin (MULTIVITAMIN) tablet Take 1 tablet by mouth daily.   Yes [provider]   Omega-3 Fatty Acids (FISH OIL) 1000 MG CAPS Take 1,000 mg by mouth daily.   Yes [provider]  omeprazole  (PRILOSEC) 40 MG capsule Take 1 capsule (40 mg total) by mouth daily as needed. 08/29/24  Yes Domenica Harlene LABOR, MD  triamcinolone  cream (KENALOG ) 0.1 % Apply 1 Application topically 2 (two) times daily as needed for rash. 02/01/24  Yes Domenica Harlene LABOR, MD    Family History Family History  Problem Relation Age of Onset   Hypertension Mother    Hyperlipidemia Mother    Cancer Mother 29       colon    Colon cancer Mother    COPD Father    Breast cancer Neg Hx    Esophageal cancer Neg Hx    Pancreatic cancer Neg Hx    Stomach cancer Neg Hx    Inflammatory bowel disease Neg Hx    Liver disease Neg Hx     Social History Social History[1]   Allergies   Patient has no known allergies.   Review of Systems Review of Systems  Constitutional:  Positive for activity change. Negative for appetite change, fatigue and fever.  Respiratory:  Negative for shortness of breath.   Cardiovascular:  Negative for chest pain.  Gastrointestinal:  Negative for nausea and vomiting.  Musculoskeletal:  Positive for neck pain. Negative for arthralgias and myalgias.  Skin:  Negative for rash.  Neurological:  Positive for numbness (into right arm). Negative for dizziness, weakness, light-headedness and headaches.     Physical Exam Triage Vital Signs ED Triage Vitals  Encounter Vitals Group     BP 12/08/24 0822 (!) 148/78     Girls Systolic BP Percentile --      Girls Diastolic BP Percentile --      Boys Systolic BP Percentile --      Boys Diastolic BP Percentile --      Pulse Rate 12/08/24 0822 62     Resp 12/08/24 0822 18     Temp 12/08/24 0822 98.1 F (36.7 C)     Temp Source 12/08/24 0822 Oral     SpO2 12/08/24 0822 97 %     Weight --      Height --      Head Circumference --      Peak Flow --      Pain Score 12/08/24 0821 9     Pain Loc --      Pain Education --       Exclude from Growth Chart --    No data found.  Updated Vital Signs BP (!) 148/78 (BP Location: Left Arm)   Pulse 62   Temp 98.1 F (36.7 C) (Oral)   Resp 18   LMP 01/08/2016 Comment: Irregular per patient  SpO2 97%   Visual Acuity Right Eye Distance:   Left Eye Distance:   Bilateral Distance:    Right Eye Near:  Left Eye Near:    Bilateral Near:     Physical Exam Vitals reviewed.  Constitutional:      General: She is awake. She is not in acute distress.    Appearance: Normal appearance. She is well-developed. She is not ill-appearing.     Comments: Very pleasant female appears stated age in no acute distress sitting comfortably in exam room  HENT:     Head: Normocephalic and atraumatic.  Cardiovascular:     Rate and Rhythm: Normal rate and regular rhythm.     Heart sounds: Normal heart sounds, S1 normal and S2 normal. No murmur heard. Pulmonary:     Effort: Pulmonary effort is normal.     Breath sounds: Normal breath sounds. No wheezing, rhonchi or rales.     Comments: Clear to auscultation bilaterally Musculoskeletal:     Cervical back: Tenderness and bony tenderness present.     Thoracic back: No tenderness or bony tenderness.     Lumbar back: No tenderness or bony tenderness.     Comments: Back: Pain with percussion of lower cervical vertebrae (C6/C7) without deformity or step-off.  Normal active range of motion.  Tenderness palpation of right cervical paraspinal muscles and along trapezius.  Strength 5/5 bilateral upper extremities.  Normal pincer and grip strength.  Psychiatric:        Behavior: Behavior is cooperative.      UC Treatments / Results  Labs (all labs ordered are listed, but only abnormal results are displayed) Labs Reviewed - No data to display  EKG   Radiology No results found.  Procedures Procedures (including critical care time)  Medications Ordered in UC Medications  ketorolac  (TORADOL ) 30 MG/ML injection 30 mg (30 mg  Intramuscular Given 12/08/24 0855)  dexamethasone  (DECADRON ) injection 10 mg (10 mg Intramuscular Given 12/08/24 0856)    Initial Impression / Assessment and Plan / UC Course  I have reviewed the triage vital signs and the nursing notes.  Pertinent labs & imaging results that were available during my care of the patient were reviewed by me and considered in my medical decision making (see chart for details).     Patient is well-appearing, afebrile, nontoxic, nontachycardic.  Hand is neurovascularly intact.  X-ray was obtained given radiculopathy symptoms that showed degenerative changes and straightening of lordosis but no acute osseous abnormality based on my primary read.  At the time of discharge we were waiting for radiologist over read and will contact her if this differs and changes our treatment plan.  She was given 10 mg of Decadron  and 30 mg of Toradol  in clinic which provided improvement of symptoms (her pain improved to a 5 at the time of discharge).  No indication for dose adjustment based on metabolic panel from 02/01/2024 with a creatinine of 0.68 and calculated creatinine clearance of 82 mL/min.  We discussed that she is not to take NSAIDs for the next 12 to 24 hours but can use Tylenol  for breakthrough pain.  She was prescribed methocarbamol  to be taken up to twice a day we discussed that this can be sedating and she is not to drive or drink alcohol while taking it.  She was also given lidocaine  patches for additional pain relief.  Given her recurrent symptoms and obvious degenerative disc disease on x-ray I did recommend she follow-up with specialist and was given the contact information for local provider.  She reports having a follow-up appoint with her primary care in the near future and will discuss potential referral  and recommendations with them.  If she has any worsening symptoms she is to return for reevaluation.  Return precautions given.  Excuse note provided.  Final Clinical  Impressions(s) / UC Diagnoses   Final diagnoses:  Cervical disc disorder with radiculopathy of cervical region  Neck pain     Discharge Instructions      Your x-ray showed significant arthritis as well as straightening of your spine which is often seen with muscle spasms.  If the radiologist sees something else and we need to change her treatment plan I will call you.  We gave you an injection of steroids as well as Toradol .  Because of the injection of Toradol , do not take NSAIDs (aspirin, ibuprofen/Advil, naproxen/Aleve) for the next 24 hours.  You can use Tylenol /acetaminophen .  Start methocarbamol  twice a day.  This will make you sleepy so do not drive or drink alcohol with taking it.  Apply lidocaine  patch during the day and then remove this at night.  Use only 1 patch per 24 hours.  I do recommend you follow-up with orthopedics; call to schedule an appointment.  If anything worsens you have increasing pain, numbness or tingling in your hand that is worsening, fever, rash, severe headache you need to be seen immediately.     ED Prescriptions     Medication Sig Dispense Auth. Provider   lidocaine  (LIDODERM ) 5 % Place 1 patch onto the skin daily. Remove & Discard patch within 12 hours or as directed by MD 30 patch Darell Saputo K, PA-C   methocarbamol  (ROBAXIN ) 500 MG tablet Take 1 tablet (500 mg total) by mouth 2 (two) times daily. 20 tablet Mickey Esguerra K, PA-C      PDMP not reviewed this encounter.     [1]  Social History Tobacco Use   Smoking status: Never   Smokeless tobacco: Never  Vaping Use   Vaping status: Never Used  Substance Use Topics   Alcohol use: No   Drug use: No     Sherrell Rocky POUR, PA-C 12/08/24 0914  "

## 2024-12-08 NOTE — ED Triage Notes (Signed)
 Patient has a severe pain starting in the right side of the neck going down into the arm with numbness in the pointer finger. Onset of current flare 4-5 days ago. States has a history of radiculopathy.   Patient alternating tylenol  with ibuprofen with little relief.

## 2024-12-08 NOTE — Discharge Instructions (Signed)
 Your x-ray showed significant arthritis as well as straightening of your spine which is often seen with muscle spasms.  If the radiologist sees something else and we need to change her treatment plan I will call you.  We gave you an injection of steroids as well as Toradol .  Because of the injection of Toradol , do not take NSAIDs (aspirin, ibuprofen/Advil, naproxen/Aleve) for the next 24 hours.  You can use Tylenol /acetaminophen .  Start methocarbamol  twice a day.  This will make you sleepy so do not drive or drink alcohol with taking it.  Apply lidocaine  patch during the day and then remove this at night.  Use only 1 patch per 24 hours.  I do recommend you follow-up with orthopedics; call to schedule an appointment.  If anything worsens you have increasing pain, numbness or tingling in your hand that is worsening, fever, rash, severe headache you need to be seen immediately.

## 2024-12-09 ENCOUNTER — Other Ambulatory Visit (HOSPITAL_COMMUNITY): Payer: Self-pay

## 2024-12-09 ENCOUNTER — Other Ambulatory Visit (HOSPITAL_BASED_OUTPATIENT_CLINIC_OR_DEPARTMENT_OTHER): Payer: Self-pay

## 2024-12-10 ENCOUNTER — Other Ambulatory Visit (HOSPITAL_COMMUNITY): Payer: Self-pay

## 2024-12-10 ENCOUNTER — Telehealth

## 2024-12-10 ENCOUNTER — Other Ambulatory Visit (HOSPITAL_BASED_OUTPATIENT_CLINIC_OR_DEPARTMENT_OTHER): Payer: Self-pay

## 2024-12-10 DIAGNOSIS — M5412 Radiculopathy, cervical region: Secondary | ICD-10-CM | POA: Diagnosis not present

## 2024-12-10 MED ORDER — PREDNISONE 10 MG (21) PO TBPK
ORAL_TABLET | ORAL | 0 refills | Status: AC
  Filled 2024-12-10: qty 21, 6d supply, fill #0

## 2024-12-10 NOTE — Patient Instructions (Signed)
 " Tara Greene, thank you for joining [name], NP for today's virtual visit.  While this provider is not your primary care provider (PCP), if your PCP is located in our provider database this encounter information will be shared with them immediately following your visit.   A [facility] MyChart account gives you access to today's visit and all your visits, tests, and labs performed at [facility]  click here if you don't have a [facility] MyChart account or go to [email or web address]  Consent: (Patient) Tara Greene provided verbal consent for this virtual visit at the beginning of the encounter.  Current Medications:  Current Outpatient Medications:    predniSONE  (STERAPRED UNI-PAK 21 TAB) 10 MG (21) TBPK tablet, Take as directed, Disp: 21 tablet, Rfl: 0   amLODipine  (NORVASC ) 10 MG tablet, Take 1 tablet (10 mg total) by mouth daily., Disp: 90 tablet, Rfl: 1   cholecalciferol (VITAMIN D ) 1000 units tablet, Take 1 tablet (1,000 Units total) by mouth daily., Disp: , Rfl:    ferrous sulfate (SM IRON) 325 (65 FE) MG tablet, , Disp: , Rfl:    fexofenadine  (ALLEGRA ) 180 MG tablet, Take 1 tablet (180 mg total) by mouth daily. (Patient taking differently: Take 180 mg by mouth daily as needed for allergies.), Disp: , Rfl:    lidocaine  (LIDODERM ) 5 %, Place 1 patch onto the skin daily. Remove & Discard patch within 12 hours or as directed by MD, Disp: 30 patch, Rfl: 0   methocarbamol  (ROBAXIN ) 500 MG tablet, Take 1 tablet (500 mg total) by mouth 2 (two) times daily., Disp: 20 tablet, Rfl: 0   Multiple Vitamin (MULTIVITAMIN) tablet, Take 1 tablet by mouth daily., Disp: , Rfl:    Omega-3 Fatty Acids (FISH OIL) 1000 MG CAPS, Take 1,000 mg by mouth daily., Disp: , Rfl:    omeprazole  (PRILOSEC) 40 MG capsule, Take 1 capsule (40 mg total) by mouth daily as needed., Disp: 90 capsule, Rfl: 1   triamcinolone  cream (KENALOG ) 0.1 %, Apply 1 Application topically 2 (two) times daily  as needed for rash., Disp: 80 g, Rfl: 2   Medications ordered in this encounter:  Meds ordered this encounter  Medications   predniSONE  (STERAPRED UNI-PAK 21 TAB) 10 MG (21) TBPK tablet    Sig: Take as directed    Dispense:  21 tablet    Refill:  0    Supervising Provider:   BLAISE ALEENE KIDD [[id number]]     *If you need refills on other medications prior to your next appointment, please contact your pharmacy*  Follow-Up: Call back or seek an in-person evaluation if the symptoms worsen or if the condition fails to improve as anticipated.  [facility] [phone]  Other Instructions  -needs to have follow up with PCP for neurosx referral, PT might be of benefit- and MRI given symptoms for nerve encroachment issues -continue lidocaine , robaxin  and pred pack  -strict in person follow up if not improving  If you have been instructed to have an in-person evaluation today at a local Urgent Care facility, please use the link below. It will take you to a list of all of our available [facility], including address, phone number and hours of operation. Please do not delay care.  [facility]  If you or a family member do not have a primary care provider, use the link below to schedule a visit and establish care. When you choose a [facility]  primary care physician or advanced practice provider, you gain a long-term partner in health. Find a Primary Care Provider  Learn more about [facility]'s in-office and virtual care options: [facility] - Get Care Now  "

## 2024-12-10 NOTE — Progress Notes (Signed)
 " Virtual Visit Consent   Tara Greene, you are scheduled for a virtual visit with a [facility] provider today. Just as with appointments in the office, your consent must be obtained to participate. Your consent will be active for this visit and any virtual visit you may have with one of our providers in the next 365 days. If you have a MyChart account, a copy of this consent can be sent to you electronically.  As this is a virtual visit, video technology does not allow for your provider to perform a traditional examination. This may limit your provider's ability to fully assess your condition. If your provider identifies any concerns that need to be evaluated in person or the need to arrange testing (such as labs, EKG, etc.), we will make arrangements to do so. Although advances in technology are sophisticated, we cannot ensure that it will always work on either your end or our end. If the connection with a video visit is poor, the visit may have to be switched to a telephone visit. With either a video or telephone visit, we are not always able to ensure that we have a secure connection.  By engaging in this virtual visit, you consent to the provision of healthcare and authorize for your insurance to be billed (if applicable) for the services provided during this visit. Depending on your insurance coverage, you may receive a charge related to this service.  I need to obtain your verbal consent now. Are you willing to proceed with your visit today? MARIPOSA SHORES has provided verbal consent on 12/10/2024 for a virtual visit (video or telephone). [name], NP  Date: 12/10/2024 10:14 AM   Virtual Visit via Video Note   I, [name], connected with  Tara Greene  ([id number], 1971-09-15) on 12/10/2024 at 10:15 AM EST by a video-enabled telemedicine application and verified that I am speaking with the correct person using two identifiers.  Location: Patient: Virtual Visit Location  Patient: Home Provider: Virtual Visit Location Provider: Home Office   I discussed the limitations of evaluation and management by telemedicine and the availability of in person appointments. The patient expressed understanding and agreed to proceed.    History of Present Illness: Tara Greene is a 54 y.o. who identifies as a female who was assigned female at birth, and is being seen today for neck pain that is causing a lot of pain and not responding much to lidocaine  patches or robaxin  is not lasting long at all. Reports having prednisone  in the past and that helps a lot more. She was just seen on 12/08/24 at [facility] and given Toradol  and Dex injection with the above scripts.   Pain level right now is 8/10. And is located on the right side and travels down to the shoulder to arm and to first finger.  She does have changes on xray at UC suggestive of arthritis.     Problems:  Patient Active Problem List   Diagnosis Date Noted   Annual visit for general adult medical examination without abnormal findings 08/13/2024   Family history of colon cancer in mother 11/06/2020   Colon cancer screening 11/06/2020   Neck pain 08/21/2017   HTN (hypertension) 07/10/2016   GERD (gastroesophageal reflux disease) 07/10/2016   Abnormal TSH 06/14/2015   Perimenopause 04/12/2014   Eczema 10/18/2013   Allergy    Anemia 09/15/2012   Hyperlipidemia 09/15/2012   Encounter for routine gynecological examination 09/27/2011   Preventative health  care 09/20/2011    Allergies: Allergies[1] Medications: Current Medications[2]  Observations/Objective: Patient is well-developed, well-nourished is in mild distress.  Tearful  Head is normocephalic, atraumatic.  No labored breathing.  Speech is clear and coherent with logical content.  Patient is alert and oriented at baseline.    Assessment and Plan:   1. Cervical radiculopathy (Primary)  - predniSONE  (STERAPRED UNI-PAK 21 TAB) 10 MG (21) TBPK tablet;  Take as directed  Dispense: 21 tablet; Refill: 0  -needs to have follow up with PCP for neurosx referral, PT might be of benefit- and MRI given symptoms for nerve encroachment issues -continue lidocaine , robaxin  and pred pack  -strict in person follow up if not improving  Patient acknowledged agreement and understanding of the plan.     Follow Up Instructions: I discussed the assessment and treatment plan with the patient. The patient was provided an opportunity to ask questions and all were answered. The patient agreed with the plan and demonstrated an understanding of the instructions.  A copy of instructions were sent to the patient via MyChart unless otherwise noted below.    The patient was advised to call back or seek an in-person evaluation if the symptoms worsen or if the condition fails to improve as anticipated.    [name], NP     [1] No Known Allergies [2]  Current Outpatient Medications:    amLODipine  (NORVASC ) 10 MG tablet, Take 1 tablet (10 mg total) by mouth daily., Disp: 90 tablet, Rfl: 1   cholecalciferol (VITAMIN D ) 1000 units tablet, Take 1 tablet (1,000 Units total) by mouth daily., Disp: , Rfl:    ferrous sulfate (SM IRON) 325 (65 FE) MG tablet, , Disp: , Rfl:    fexofenadine  (ALLEGRA ) 180 MG tablet, Take 1 tablet (180 mg total) by mouth daily. (Patient taking differently: Take 180 mg by mouth daily as needed for allergies.), Disp: , Rfl:    lidocaine  (LIDODERM ) 5 %, Place 1 patch onto the skin daily. Remove & Discard patch within 12 hours or as directed by MD, Disp: 30 patch, Rfl: 0   methocarbamol  (ROBAXIN ) 500 MG tablet, Take 1 tablet (500 mg total) by mouth 2 (two) times daily., Disp: 20 tablet, Rfl: 0   Multiple Vitamin (MULTIVITAMIN) tablet, Take 1 tablet by mouth daily., Disp: , Rfl:    Omega-3 Fatty Acids (FISH OIL) 1000 MG CAPS, Take 1,000 mg by mouth daily., Disp: , Rfl:    omeprazole  (PRILOSEC) 40 MG capsule, Take 1 capsule (40 mg total) by mouth  daily as needed., Disp: 90 capsule, Rfl: 1   triamcinolone  cream (KENALOG ) 0.1 %, Apply 1 Application topically 2 (two) times daily as needed for rash., Disp: 80 g, Rfl: 2  "

## 2024-12-31 DIAGNOSIS — R7303 Prediabetes: Secondary | ICD-10-CM | POA: Insufficient documentation

## 2024-12-31 NOTE — Assessment & Plan Note (Signed)
 Stable on omeprazole . Uses prn, 1-2 times per week. Avoid offending foods, start probiotics. Do not eat large meals in late evening and consider raising head of bed

## 2024-12-31 NOTE — Assessment & Plan Note (Signed)
 Well controlled, no changes to meds. Encouraged heart healthy diet such as the DASH diet and exercise as tolerated.

## 2024-12-31 NOTE — Assessment & Plan Note (Signed)
 [id number] acceptable, minimize simple carbs. Increase exercise as tolerated. Continue current meds

## 2024-12-31 NOTE — Progress Notes (Unsigned)
 "  Subjective:     Patient ID: Tara Greene, female    DOB: 1971-09-10, 54 y.o.   MRN: [id number]  No chief complaint on file.   HPI  Discussed the use of AI scribe software for clinical note transcription with the patient, who gave verbal consent to proceed.  History of Present Illness          Tara Greene is a 54 yo female presents for follow up  ***CAC self pay score??  HTN-amlodipine  10 mg daily Home BP-***   HLD-OMega 3 fatty acids 1000 mg daily   GERD- omeprazole  40 mg daily   Vitamin D  deficiency-vitamin D  1000 units daily   Patient denies fever, chills, SOB, CP, palpitations, dyspnea, edema, HA, vision changes, N/V/D, abdominal pain, urinary symptoms, rash, weight changes, and recent illness or hospitalizations.     Health Maintenance Due  Topic Date Due   Hepatitis B Vaccines 19-59 Average Risk (1 of 3 - 19+ 3-dose series) Never done   DTaP/Tdap/Td (2 - Td or Tdap) 06/28/2023    Past Medical History:  Diagnosis Date   Abnormal TSH 06/14/2015   Allergy    Anemia    Dehydration 01/27/2017   Dermatitis 10/18/2013   GERD (gastroesophageal reflux disease)    Hyperlipidemia    Hypertension    Other and unspecified hyperlipidemia 10/18/2013   Perimenopause 04/12/2014    Past Surgical History:  Procedure Laterality Date   COLONOSCOPY     LAPAROSCOPIC APPENDECTOMY N/A 03/11/2021   Procedure: APPENDECTOMY LAPAROSCOPIC;  Surgeon: Debby Hila, MD;  Location: [facility];  Service: General;  Laterality: N/A;  60 minutes   LAPAROSCOPIC APPENDECTOMY N/A 03/26/2021   Procedure: APPENDECTOMY LAPAROSCOPIC;  Surgeon: Debby Hila, MD;  Location: [facility];  Service: General;  Laterality: N/A;  60 minutes   NO PAST SURGERIES      Family History  Problem Relation Age of Onset   Hypertension Mother    Hyperlipidemia Mother    Cancer Mother 82       colon    Colon cancer Mother    COPD Father    Breast cancer Neg Hx    Esophageal cancer Neg Hx    Pancreatic  cancer Neg Hx    Stomach cancer Neg Hx    Inflammatory bowel disease Neg Hx    Liver disease Neg Hx     Social History   Socioeconomic History   Marital status: Married    Spouse name: Not on file   Number of children: 3   Years of education: Not on file   Highest education level: Bachelor's degree (e.g., BA, AB, BS)  Occupational History   Occupation: RN- [employer]  Tobacco Use   Smoking status: Never   Smokeless tobacco: Never  Vaping Use   Vaping status: Never Used  Substance and Sexual Activity   Alcohol use: No   Drug use: No   Sexual activity: Yes    Comment: husband and 3 daughters, nurse at [employer], 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, no dietary restrictions  Other Topics Concern   Not on file  Social History Narrative   Not on file   Social Drivers of Health   Tobacco Use: Low Risk (12/10/2024)   Patient History    Smoking Tobacco Use: Never    Smokeless Tobacco Use: Never    Passive Exposure: Not on file  Financial Resource Strain: Low Risk (08/03/2024)   Overall Financial Resource Strain (CARDIA)    Difficulty of Paying Living Expenses: Not hard  at all  Food Insecurity: No Food Insecurity (08/03/2024)   Epic    Worried About Programme Researcher, Broadcasting/film/video in the Last Year: Never true    Ran Out of Food in the Last Year: Never true  Transportation Needs: No Transportation Needs (08/03/2024)   Epic    Lack of Transportation (Medical): No    Lack of Transportation (Non-Medical): No  Physical Activity: Insufficiently Active (08/03/2024)   Exercise Vital Sign    Days of Exercise per Week: 3 days    Minutes of Exercise per Session: 40 min  Stress: Stress Concern Present (08/03/2024)   [employer] of Occupational Health - Occupational Stress Questionnaire    Feeling of Stress: To some extent  Social Connections: Moderately Integrated (08/03/2024)   Social Connection and Isolation Panel    Frequency of Communication with Friends and Family: More than three times a week    Frequency of Social Gatherings with  Friends and Family: Once a week    Attends Religious Services: More than 4 times per year    Active Member of [employer] or Organizations: No    Attends Engineer, Structural: Not on file    Marital Status: Married  Catering Manager Violence: Not on file  Depression (PHQ2-9): Low Risk (08/13/2024)   Depression (PHQ2-9)    PHQ-2 Score: 0  Alcohol Screen: Low Risk (01/26/2024)   Alcohol Screen    Last Alcohol Screening Score (AUDIT): 1  Housing: Low Risk (08/03/2024)   Epic    Unable to Pay for Housing in the Last Year: No    Number of Times Moved in the Last Year: 0    Homeless in the Last Year: No  Utilities: Not on file  Health Literacy: Not on file    Outpatient Medications Prior to Visit  Medication Sig Dispense Refill   amLODipine  (NORVASC ) 10 MG tablet Take 1 tablet (10 mg total) by mouth daily. 90 tablet 1   cholecalciferol (VITAMIN D ) 1000 units tablet Take 1 tablet (1,000 Units total) by mouth daily.     ferrous sulfate (SM IRON) 325 (65 FE) MG tablet      fexofenadine  (ALLEGRA ) 180 MG tablet Take 1 tablet (180 mg total) by mouth daily. (Patient taking differently: Take 180 mg by mouth daily as needed for allergies.)     lidocaine  (LIDODERM ) 5 % Place 1 patch onto the skin daily. Remove & Discard patch within 12 hours or as directed by MD 30 patch 0   methocarbamol  (ROBAXIN ) 500 MG tablet Take 1 tablet (500 mg total) by mouth 2 (two) times daily. 20 tablet 0   Multiple Vitamin (MULTIVITAMIN) tablet Take 1 tablet by mouth daily.     Omega-3 Fatty Acids (FISH OIL) 1000 MG CAPS Take 1,000 mg by mouth daily.     omeprazole  (PRILOSEC) 40 MG capsule Take 1 capsule (40 mg total) by mouth daily as needed. 90 capsule 1   predniSONE  (STERAPRED UNI-PAK 21 TAB) 10 MG (21) TBPK tablet Take as directed 21 tablet 0   triamcinolone  cream (KENALOG ) 0.1 % Apply 1 Application topically 2 (two) times daily as needed for rash. 80 g 2   No facility-administered medications prior to visit.     Allergies[1]  ROS     Objective:    Physical Exam Vitals reviewed.  Constitutional:      General: She is not in acute distress.    Appearance: She is not toxic-appearing.  HENT:     Head: Normocephalic and atraumatic.  Mouth/Throat:     Mouth: Mucous membranes are moist.     Pharynx: Oropharynx is clear.  Eyes:     Pupils: Pupils are equal, round, and reactive to light.  Cardiovascular:     Rate and Rhythm: Normal rate and regular rhythm.     Pulses: Normal pulses.     Heart sounds: Normal heart sounds. No murmur heard. Pulmonary:     Effort: Pulmonary effort is normal. No respiratory distress.     Breath sounds: Normal breath sounds. No wheezing.  Musculoskeletal:        General: No swelling.     Cervical back: Neck supple.  Skin:    General: Skin is warm and dry.  Neurological:     General: No focal deficit present.     Mental Status: She is alert and oriented to person, place, and time.  Psychiatric:        Mood and Affect: Mood normal.        Behavior: Behavior normal.        Thought Content: Thought content normal.        Judgment: Judgment normal.      LMP 01/08/2016 Comment: Irregular per patient Wt Readings from Last 3 Encounters:  08/13/24 123 lb 12.8 oz (56.2 kg)  02/01/24 145 lb 12.8 oz (66.1 kg)  06/16/22 141 lb 12.8 oz (64.3 kg)       Assessment & Plan:   Problem List Items Addressed This Visit       Cardiovascular and Mediastinum   HTN (hypertension) - Primary   Well controlled, no changes to meds. Encouraged heart healthy diet such as the DASH diet and exercise as tolerated.          Digestive   GERD (gastroesophageal reflux disease)   Stable on omeprazole . Avoid offending foods, start probiotics. Do not eat large meals in late evening and consider raising head of bed          Other   Hyperlipidemia   Encourage heart healthy diet such as MIND or DASH diet, increase exercise, avoid trans fats, simple carbohydrates and  processed foods, consider a krill or fish or flaxseed oil cap daily.        Prediabetes   hgba1c acceptable, minimize simple carbs. Increase exercise as tolerated. Continue current meds       Other Visit Diagnoses       Encounter for vitamin deficiency screening           I am having Tara Greene maintain her multivitamin, fexofenadine , cholecalciferol, Fish Oil, triamcinolone  cream, amLODipine , omeprazole , ferrous sulfate, lidocaine , methocarbamol , and predniSONE .  No orders of the defined types were placed in this encounter.     [1] No Known Allergies  "

## 2025-01-01 ENCOUNTER — Ambulatory Visit: Admitting: Student

## 2025-01-01 ENCOUNTER — Encounter: Payer: Self-pay | Admitting: Student

## 2025-01-01 ENCOUNTER — Other Ambulatory Visit (HOSPITAL_BASED_OUTPATIENT_CLINIC_OR_DEPARTMENT_OTHER): Payer: Self-pay

## 2025-01-01 VITALS — BP 116/78 | HR 78 | Temp 97.6°F | Resp 16 | Ht 60.0 in | Wt 129.0 lb

## 2025-01-01 DIAGNOSIS — Z780 Asymptomatic menopausal state: Secondary | ICD-10-CM | POA: Insufficient documentation

## 2025-01-01 DIAGNOSIS — E782 Mixed hyperlipidemia: Secondary | ICD-10-CM

## 2025-01-01 DIAGNOSIS — M5412 Radiculopathy, cervical region: Secondary | ICD-10-CM | POA: Insufficient documentation

## 2025-01-01 DIAGNOSIS — I10 Essential (primary) hypertension: Secondary | ICD-10-CM

## 2025-01-01 DIAGNOSIS — R7303 Prediabetes: Secondary | ICD-10-CM

## 2025-01-01 DIAGNOSIS — M542 Cervicalgia: Secondary | ICD-10-CM

## 2025-01-01 DIAGNOSIS — K219 Gastro-esophageal reflux disease without esophagitis: Secondary | ICD-10-CM

## 2025-01-01 DIAGNOSIS — Z1321 Encounter for screening for nutritional disorder: Secondary | ICD-10-CM

## 2025-01-01 LAB — CBC WITH DIFFERENTIAL/PLATELET
Basophils Absolute: 0 10*3/uL (ref 0.0–0.1)
Basophils Relative: 0.5 % (ref 0.0–3.0)
Eosinophils Absolute: 0.2 10*3/uL (ref 0.0–0.7)
Eosinophils Relative: 3.6 % (ref 0.0–5.0)
HCT: 37.5 % (ref 36.0–46.0)
Hemoglobin: 13 g/dL (ref 12.0–15.0)
Lymphocytes Relative: 36.8 % (ref 12.0–46.0)
Lymphs Abs: 1.9 10*3/uL (ref 0.7–4.0)
MCHC: 34.6 g/dL (ref 30.0–36.0)
MCV: 86.6 fl (ref 78.0–100.0)
Monocytes Absolute: 0.3 10*3/uL (ref 0.1–1.0)
Monocytes Relative: 5.1 % (ref 3.0–12.0)
Neutro Abs: 2.7 10*3/uL (ref 1.4–7.7)
Neutrophils Relative %: 54 % (ref 43.0–77.0)
Platelets: 301 10*3/uL (ref 150.0–400.0)
RBC: 4.33 Mil/uL (ref 3.87–5.11)
RDW: 13.6 % (ref 11.5–15.5)
WBC: 5.1 10*3/uL (ref 4.0–10.5)

## 2025-01-01 LAB — COMPREHENSIVE METABOLIC PANEL WITH GFR
ALT: 11 U/L (ref 3–35)
AST: 14 U/L (ref 5–37)
Albumin: 4.4 g/dL (ref 3.5–5.2)
Alkaline Phosphatase: 56 U/L (ref 39–117)
BUN: 10 mg/dL (ref 6–23)
CO2: 29 meq/L (ref 19–32)
Calcium: 9.4 mg/dL (ref 8.4–10.5)
Chloride: 102 meq/L (ref 96–112)
Creatinine, Ser: 0.68 mg/dL (ref 0.40–1.20)
GFR: 97.66 mL/min
Glucose, Bld: 87 mg/dL (ref 70–99)
Potassium: 3.6 meq/L (ref 3.5–5.1)
Sodium: 140 meq/L (ref 135–145)
Total Bilirubin: 0.8 mg/dL (ref 0.2–1.2)
Total Protein: 6.9 g/dL (ref 6.0–8.3)

## 2025-01-01 LAB — TSH: TSH: 2.22 u[IU]/mL (ref 0.35–5.50)

## 2025-01-01 LAB — LIPID PANEL
Cholesterol: 263 mg/dL — ABNORMAL HIGH (ref 28–200)
HDL: 61.1 mg/dL
LDL Cholesterol: 179 mg/dL — ABNORMAL HIGH (ref 10–99)
NonHDL: 201.74
Total CHOL/HDL Ratio: 4
Triglycerides: 113 mg/dL (ref 10.0–149.0)
VLDL: 22.6 mg/dL (ref 0.0–40.0)

## 2025-01-01 LAB — VITAMIN D 25 HYDROXY (VIT D DEFICIENCY, FRACTURES): VITD: 26.73 ng/mL — ABNORMAL LOW (ref 30.00–100.00)

## 2025-01-01 LAB — HEMOGLOBIN A1C: Hgb A1c MFr Bld: 5.8 % (ref 4.6–6.5)

## 2025-01-01 MED ORDER — MELOXICAM 7.5 MG PO TABS
7.5000 mg | ORAL_TABLET | Freq: Every day | ORAL | 0 refills | Status: AC | PRN
  Filled 2025-01-01: qty 15, 15d supply, fill #0

## 2025-01-01 MED ORDER — LIDOCAINE 5 % EX PTCH
1.0000 | MEDICATED_PATCH | CUTANEOUS | 0 refills | Status: AC
  Filled 2025-01-01: qty 30, 30d supply, fill #0

## 2025-01-01 NOTE — Assessment & Plan Note (Signed)
 Recent flare sent Pt to urgent care this month. She was given course of steroids, Robaxin , and lidocaine  patches and this has helped. However, she continues to have moderate pain involving her neck and upper right trapezius area. Cervical spine  Xray 12/08/2024 noted moderate DDD with no acute abnormalities.  -Discussed dry needling and physical therapy benefits, Referred to PT. -Rx- meloxicam  7.5 mg prn - May use OTC Tylenol   as needed for breakthrough pain. -May use lidocaine  patches, moist heat - Instructed to monitor symptoms and consider further imaging if symptoms persist beyond six weeks or worsen.

## 2025-01-01 NOTE — Assessment & Plan Note (Signed)
 Encouraged daily intake of calcium and vitamin D . Advised engaging in regular exercise as tolerated to support overall bone and CV health.  Referred for DEXA scan

## 2025-01-02 ENCOUNTER — Ambulatory Visit: Payer: Self-pay | Admitting: Student

## 2025-01-02 ENCOUNTER — Other Ambulatory Visit (HOSPITAL_BASED_OUTPATIENT_CLINIC_OR_DEPARTMENT_OTHER): Payer: Self-pay

## 2025-01-02 DIAGNOSIS — E559 Vitamin D deficiency, unspecified: Secondary | ICD-10-CM

## 2025-01-02 MED ORDER — VITAMIN D (ERGOCALCIFEROL) 1.25 MG (50000 UNIT) PO CAPS
50000.0000 [IU] | ORAL_CAPSULE | ORAL | 3 refills | Status: AC
  Filled 2025-01-02: qty 4, 28d supply, fill #0

## 2025-02-06 ENCOUNTER — Encounter: Admitting: Family Medicine
